# Patient Record
Sex: Female | Born: 1990 | Race: Black or African American | Hispanic: No | Marital: Single | Smoking: Never smoker
Health system: Southern US, Community
[De-identification: ages and names within clinical notes are randomized; demographics above are authoritative.]

## PROBLEM LIST (undated history)

## (undated) ENCOUNTER — Inpatient Hospital Stay (HOSPITAL_COMMUNITY): Payer: Self-pay

## (undated) DIAGNOSIS — F419 Anxiety disorder, unspecified: Secondary | ICD-10-CM

## (undated) DIAGNOSIS — F32A Depression, unspecified: Secondary | ICD-10-CM

## (undated) DIAGNOSIS — N750 Cyst of Bartholin's gland: Secondary | ICD-10-CM

## (undated) DIAGNOSIS — F329 Major depressive disorder, single episode, unspecified: Secondary | ICD-10-CM

## (undated) DIAGNOSIS — O139 Gestational [pregnancy-induced] hypertension without significant proteinuria, unspecified trimester: Secondary | ICD-10-CM

## (undated) DIAGNOSIS — D352 Benign neoplasm of pituitary gland: Secondary | ICD-10-CM

## (undated) HISTORY — DX: Benign neoplasm of pituitary gland: D35.2

## (undated) HISTORY — PX: TRANSPHENOIDAL / TRANSNASAL HYPOPHYSECTOMY / RESECTION PITUITARY TUMOR: SUR1382

---

## 2012-04-13 ENCOUNTER — Emergency Department (HOSPITAL_COMMUNITY)
Admission: EM | Admit: 2012-04-13 | Discharge: 2012-04-13 | Discharge status: Against Medical Advice | Payer: BC Managed Care – PPO | Source: Home / Self Care

## 2012-04-13 ENCOUNTER — Encounter (HOSPITAL_COMMUNITY): Payer: Self-pay | Admitting: *Deleted

## 2012-04-13 ENCOUNTER — Encounter (HOSPITAL_COMMUNITY): Payer: Self-pay

## 2012-04-13 DIAGNOSIS — R1011 Right upper quadrant pain: Secondary | ICD-10-CM | POA: Insufficient documentation

## 2012-04-13 DIAGNOSIS — R1013 Epigastric pain: Secondary | ICD-10-CM | POA: Insufficient documentation

## 2012-04-13 DIAGNOSIS — R109 Unspecified abdominal pain: Secondary | ICD-10-CM | POA: Insufficient documentation

## 2012-04-13 LAB — COMPREHENSIVE METABOLIC PANEL
ALT: 6 U/L (ref 0–35)
AST: 13 U/L (ref 0–37)
Alkaline Phosphatase: 53 U/L (ref 39–117)
CO2: 25 mEq/L (ref 19–32)
Chloride: 102 mEq/L (ref 96–112)
GFR calc non Af Amer: >90 mL/min (ref >90)
Sodium: 136 mEq/L (ref 135–145)
Total Bilirubin: 0.1 mg/dL — ABNORMAL LOW (ref 0.3–1.2)

## 2012-04-13 LAB — CBC WITH DIFFERENTIAL/PLATELET
Basophils Absolute: 0 10*3/uL (ref 0.0–0.1)
HCT: 37.1 % (ref 36.0–46.0)
Lymphocytes Relative: 21 % (ref 12–46)
Monocytes Absolute: 0.7 10*3/uL (ref 0.1–1.0)
Neutro Abs: 5.9 10*3/uL (ref 1.7–7.7)
Platelets: 415 10*3/uL — ABNORMAL HIGH (ref 150–400)
RBC: 4.43 MIL/uL (ref 3.87–5.11)
RDW: 14.6 % (ref 11.5–15.5)
WBC: 8.4 10*3/uL (ref 4.0–10.5)

## 2012-04-13 NOTE — ED Notes (Signed)
The pt cannot void she just voided

## 2012-04-13 NOTE — ED Notes (Signed)
Pt eating bojangles in waiting room, pt updated on wait times, and number ahead of her.

## 2012-04-13 NOTE — ED Notes (Signed)
Pt complans of cramp onright side, and flank area, feels hard to take a deep breath, bloated feeling, fever per school md, sts noreleif with gas x.

## 2012-04-13 NOTE — ED Notes (Signed)
Rt abd pain for one week with nausea.  She was here earlier today but left without being seen .  Blood drawn  Then no urine obtained.  lmp aug 24th.  Longer period

## 2012-04-14 ENCOUNTER — Emergency Department (HOSPITAL_COMMUNITY): Payer: BC Managed Care – PPO

## 2012-04-14 ENCOUNTER — Emergency Department (HOSPITAL_COMMUNITY)
Admission: EM | Admit: 2012-04-14 | Discharge: 2012-04-14 | Disposition: A | Discharge status: Home / Self Care | Payer: BC Managed Care – PPO | Attending: Emergency Medicine | Admitting: Emergency Medicine

## 2012-04-14 DIAGNOSIS — R109 Unspecified abdominal pain: Secondary | ICD-10-CM

## 2012-04-14 LAB — COMPREHENSIVE METABOLIC PANEL
ALT: 5 U/L (ref 0–35)
AST: 12 U/L (ref 0–37)
CO2: 25 mEq/L (ref 19–32)
Calcium: 9.6 mg/dL (ref 8.4–10.5)
Chloride: 100 mEq/L (ref 96–112)
Creatinine, Ser: 0.53 mg/dL (ref 0.50–1.10)
GFR calc Af Amer: >90 mL/min (ref >90)
GFR calc non Af Amer: >90 mL/min (ref >90)
Glucose, Bld: 126 mg/dL — ABNORMAL HIGH (ref 70–99)
Total Bilirubin: 0.1 mg/dL — ABNORMAL LOW (ref 0.3–1.2)

## 2012-04-14 LAB — URINE MICROSCOPIC-ADD ON

## 2012-04-14 LAB — URINALYSIS, ROUTINE W REFLEX MICROSCOPIC
Glucose, UA: NEGATIVE mg/dL
Ketones, ur: 15 mg/dL — AB
Protein, ur: NEGATIVE mg/dL
Urobilinogen, UA: 0.2 mg/dL (ref 0.0–1.0)

## 2012-04-14 LAB — CBC WITH DIFFERENTIAL/PLATELET
Basophils Absolute: 0 10*3/uL (ref 0.0–0.1)
Eosinophils Relative: 0 % (ref 0–5)
Lymphocytes Relative: 15 % (ref 12–46)
Lymphs Abs: 1.6 10*3/uL (ref 0.7–4.0)
MCH: 28.2 pg (ref 26.0–34.0)
Monocytes Absolute: 0.8 10*3/uL (ref 0.1–1.0)
Neutro Abs: 8.1 10*3/uL — ABNORMAL HIGH (ref 1.7–7.7)
RDW: 14.6 % (ref 11.5–15.5)

## 2012-04-14 MED ORDER — HYDROCODONE-ACETAMINOPHEN 5-325 MG PO TABS
1.0000 | ORAL_TABLET | Freq: Once | ORAL | Status: AC
  Administered 2012-04-14: 1 via ORAL
  Filled 2012-04-14: qty 1

## 2012-04-14 MED ORDER — TRAMADOL HCL 50 MG PO TABS: 50.0000 mg | ORAL_TABLET | Freq: Four times a day (QID) | ORAL | Status: AC | PRN

## 2012-04-14 NOTE — ED Notes (Signed)
nad noted, abc intact. 

## 2012-04-14 NOTE — ED Notes (Signed)
Taken to Ultrasound

## 2012-04-14 NOTE — ED Provider Notes (Signed)
History     CSN: [id number]  Arrival date & time 04/13/12  2033   First MD Initiated Contact with Patient 04/14/12 [phone]      Chief Complaint  Patient presents with  . Abdominal Pain    (Consider location/radiation/quality/duration/timing/severity/associated sxs/prior treatment) HPI Comments: Gloria Adams is a 21 y.o. Female who has had upper abdominal pain for one week. That waxes and wanes. She has slightly worse. Pain in the right upper quadrant after eating. She denies fever, chills, nausea, vomiting, weakness, dizziness, or back pain. She has brown vaginal discharge. She denies vaginal bleeding. Her last menstrual period was 03/22/2012. No other aggravating factors. No palliative factors.  Patient is a 21 y.o. female presenting with abdominal pain. The history is provided by the patient.  Abdominal Pain The primary symptoms of the illness include abdominal pain.    History reviewed. No pertinent past medical history.  No past surgical history on file.  No family history on file.  History  Substance Use Topics  . Smoking status: Never Smoker   . Smokeless tobacco: Not on file  . Alcohol Use: Yes    OB History    Grav Para Term Preterm Abortions TAB SAB Ect Mult Living                  Review of Systems  Gastrointestinal: Positive for abdominal pain.  All other systems reviewed and are negative.    Allergies  Review of patient's allergies indicates no known allergies.  Home Medications   Current Outpatient Rx  Name Route Sig Dispense Refill  . METRONIDAZOLE 500 MG PO TABS Oral Take 500 mg by mouth 2 (two) times daily. For 7 days; Start date 04/06/12    . NORGESTIMATE-ETH ESTRADIOL 0.25-35 MG-MCG PO TABS Oral Take 1 tablet by mouth at bedtime.    . TRAMADOL HCL 50 MG PO TABS Oral Take 1 tablet (50 mg total) by mouth every 6 (six) hours as needed for pain. 15 tablet 0    BP 107/72  Pulse 94  Temp 97.8 F (36.6 C) (Oral)  Resp 18  SpO2 97%  LMP  03/22/2012  Physical Exam  Nursing note and vitals reviewed. Constitutional: She is oriented to person, place, and time. She appears well-developed and well-nourished.  HENT:  Head: Normocephalic and atraumatic.  Eyes: Conjunctivae and EOM are normal. Pupils are equal, round, and reactive to light.  Neck: Normal range of motion and phonation normal. Neck supple.  Cardiovascular: Normal rate, regular rhythm and intact distal pulses.   Pulmonary/Chest: Effort normal and breath sounds normal. She exhibits no tenderness.  Abdominal: Soft. She exhibits no distension. There is no tenderness (Right upper quadrant, moderate left upper quadrant, mild. No suprapubic or lower quadrant tenderness). There is no guarding.  Musculoskeletal: Normal range of motion.  Neurological: She is alert and oriented to person, place, and time. She has normal strength. She exhibits normal muscle tone.  Skin: Skin is warm and dry.  Psychiatric: She has a normal mood and affect. Her behavior is normal. Judgment and thought content normal.    ED Course  Procedures (including critical care time) 01:05- she declines analgesic medication.      Labs Reviewed  CBC WITH DIFFERENTIAL - Abnormal; Notable for the following:    HCT 35.1 (*)     Neutro Abs 8.1 (*)     All other components within normal limits  COMPREHENSIVE METABOLIC PANEL - Abnormal; Notable for the following:    Sodium 134 (*)  Glucose, Bld 126 (*)     Total Bilirubin 0.1 (*)     All other components within normal limits  URINALYSIS, ROUTINE W REFLEX MICROSCOPIC - Abnormal; Notable for the following:    Hgb urine dipstick MODERATE (*)     Ketones, ur 15 (*)     Leukocytes, UA SMALL (*)     All other components within normal limits  LIPASE, BLOOD  PREGNANCY, URINE  URINE MICROSCOPIC-ADD ON   US Abdomen Complete  04/14/2012  *RADIOLOGY REPORT*  Clinical Data:  Right upper quadrant and epigastric pain  ABDOMINAL ULTRASOUND COMPLETE  Comparison:   None.  Findings:  Gallbladder:  No gallstones, gallbladder wall thickening, or pericholecystic fluid.The sonographic Murphy's sign is negative.  Common Bile Duct:  Within normal limits in caliber. Measures 3 mm.  Liver: No focal mass lesion identified.  Within normal limits in parenchymal echogenicity.  IVC:  Appears normal.  Pancreas:  No abnormality identified.  Spleen:  Within normal limits in size and echotexture.  Right kidney:  Normal in size and parenchymal echogenicity.  No evidence of mass or hydronephrosis.  Left kidney:  Normal in size and parenchymal echogenicity.  No evidence of mass or hydronephrosis.  Abdominal Aorta:  No aneurysm identified.  IMPRESSION: Negative abdominal ultrasound.   Original Report Authenticated By: Britta Mccreedy, M.D.      1. Abdominal pain       MDM  Nonspecific upper abdominal pain, persistent. Unable to get U/S in ED since ate chicken in WR. Will send pt to CDU for U/S after 8 hours of fasting.      Flint Melter, MD 04/14/12 [phone]

## 2012-04-14 NOTE — ED Notes (Signed)
The pt is eating a box of bogangles chicken and drinking a drink.  Advised no abd  sacn could be performed because she ate already.  She reports that her abd pain is almost gone

## 2012-04-14 NOTE — ED Provider Notes (Signed)
Care of the patient assumed in the CDU from Effie Shy, MD. Patient presented to the emergency department with complaint of one week of right upper quadrant abdominal pain which was worse after eating. She has not had any other symptoms with this. She was moved to CDU pending an abdominal ultrasound. Her lab work appears normal. The ultrasound of her abdomen does not show any acute abnormalities, specifically no gallbladder disease. Patient is currently asymptomatic resting comfortably. Feel she is stable for discharge home at this time with followup with her PCP. Reasons to return discussed.  Grant Fontana, PA-C 04/14/12 1131

## 2012-04-14 NOTE — ED Notes (Signed)
Patient moved to CDU 4 , alert oriented ambulatory without difficulty. States she isn't having any pain right now. Lying on stretcher talking on cellphone.

## 2012-04-14 NOTE — ED Notes (Signed)
Pt waiting for ultrasound at 0800

## 2012-04-14 NOTE — ED Notes (Signed)
Return from us

## 2012-04-15 NOTE — ED Provider Notes (Signed)
Medical screening examination/treatment/procedure(s) were performed by non-physician practitioner and as supervising physician I was immediately available for consultation/collaboration.  Jones Skene, M.D.     Jones Skene, MD 04/15/12 1510

## 2012-04-25 ENCOUNTER — Encounter (HOSPITAL_COMMUNITY): Payer: Self-pay | Admitting: *Deleted

## 2012-04-25 ENCOUNTER — Emergency Department (HOSPITAL_COMMUNITY)
Admission: EM | Admit: 2012-04-25 | Discharge: 2012-04-25 | Discharge status: Against Medical Advice | Payer: BC Managed Care – PPO | Attending: Emergency Medicine | Admitting: Emergency Medicine

## 2012-04-25 DIAGNOSIS — R109 Unspecified abdominal pain: Secondary | ICD-10-CM | POA: Insufficient documentation

## 2012-04-25 LAB — CBC WITH DIFFERENTIAL/PLATELET
HCT: 36.1 % (ref 36.0–46.0)
Hemoglobin: 12.1 g/dL (ref 12.0–15.0)
Lymphocytes Relative: 25 % (ref 12–46)
Lymphs Abs: 1.9 10*3/uL (ref 0.7–4.0)
Monocytes Relative: 9 % (ref 3–12)
Neutro Abs: 5.3 10*3/uL (ref 1.7–7.7)
Neutrophils Relative %: 66 % (ref 43–77)
RBC: 4.35 MIL/uL (ref 3.87–5.11)

## 2012-04-25 LAB — COMPREHENSIVE METABOLIC PANEL
Albumin: 3.7 g/dL (ref 3.5–5.2)
Alkaline Phosphatase: 80 U/L (ref 39–117)
BUN: 11 mg/dL (ref 6–23)
CO2: 27 mEq/L (ref 19–32)
Chloride: 99 mEq/L (ref 96–112)
GFR calc non Af Amer: >90 mL/min (ref >90)
Glucose, Bld: 122 mg/dL — ABNORMAL HIGH (ref 70–99)
Potassium: 3.2 mEq/L — ABNORMAL LOW (ref 3.5–5.1)
Total Bilirubin: 0.3 mg/dL (ref 0.3–1.2)

## 2012-04-25 NOTE — ED Notes (Signed)
Called for pt, no answer

## 2012-04-25 NOTE — ED Notes (Signed)
The pt cannot void at present 

## 2012-04-25 NOTE — ED Notes (Signed)
Rt abd pain for 2 weeks and she was seen here then.  No improvement in the pain.  lmp 3 days ago.  No nv or diarrhea   More gas than usual

## 2013-02-10 ENCOUNTER — Inpatient Hospital Stay (HOSPITAL_COMMUNITY)
Admission: AD | Admit: 2013-02-10 | Discharge: 2013-02-10 | Disposition: A | Discharge status: Home / Self Care | Payer: Self-pay | Source: Ambulatory Visit | Attending: Obstetrics & Gynecology | Admitting: Obstetrics & Gynecology

## 2013-02-10 ENCOUNTER — Encounter (HOSPITAL_COMMUNITY): Payer: Self-pay | Admitting: *Deleted

## 2013-02-10 DIAGNOSIS — B9689 Other specified bacterial agents as the cause of diseases classified elsewhere: Secondary | ICD-10-CM | POA: Insufficient documentation

## 2013-02-10 DIAGNOSIS — N750 Cyst of Bartholin's gland: Secondary | ICD-10-CM | POA: Insufficient documentation

## 2013-02-10 DIAGNOSIS — N760 Acute vaginitis: Secondary | ICD-10-CM | POA: Insufficient documentation

## 2013-02-10 DIAGNOSIS — A499 Bacterial infection, unspecified: Secondary | ICD-10-CM | POA: Insufficient documentation

## 2013-02-10 MED ORDER — OXYCODONE-ACETAMINOPHEN 5-325 MG PO TABS
2.0000 | ORAL_TABLET | ORAL | Status: AC
  Administered 2013-02-10: 2 via ORAL
  Filled 2013-02-10: qty 2

## 2013-02-10 MED ORDER — LIDOCAINE 5 % EX OINT
TOPICAL_OINTMENT | CUTANEOUS | Status: AC
  Administered 2013-02-10: 18:00:00 via TOPICAL
  Filled 2013-02-10: qty 35.44

## 2013-02-10 NOTE — MAU Note (Signed)
C/o vaginal boil- ? Bartholin cyst;

## 2013-02-10 NOTE — MAU Note (Signed)
Want to [facility] 4 days ago.  7 days ago, felt a little puffiness down there, 5 days ago had sex- felt like a ball down there.  At HD was told poss bartholin's cyst.  Was told if got worse would need to get seen.   Did sitz baths, is now way worse and way bigger.

## 2013-02-10 NOTE — MAU Provider Note (Signed)
Chief Complaint: Bartholin's Cyst   None    SUBJECTIVE HPI: Gloria Adams is a 22 y.o. G0P0 at who presents to maternity admissions reporting a Bartholin cyst diagnosed at the health department this week.  She has never had this before.  She was told they cannot drain the cysts there and she should come to [facility].  She also has recurrent BV and is finishing her course of Flagyl at this time.  She denies vaginal bleeding, vaginal itching/burning, urinary symptoms, h/a, dizziness, n/v, or fever/chills.      Past Medical History  Diagnosis Date  . Medical history non-contributory    Past Surgical History  Procedure Laterality Date  . No past surgeries     History   Social History  . Marital Status: Single    Spouse Name: N/A    Number of Children: N/A  . Years of Education: N/A   Occupational History  . Not on file.   Social History Main Topics  . Smoking status: Never Smoker   . Smokeless tobacco: Not on file  . Alcohol Use: No  . Drug Use: No  . Sexually Active: Not on file   Other Topics Concern  . Not on file   Social History Narrative  . No narrative on file   No current facility-administered medications on file prior to encounter.   Current Outpatient Prescriptions on File Prior to Encounter  Medication Sig Dispense Refill  . metroNIDAZOLE (FLAGYL) 500 MG tablet Take 500 mg by mouth 2 (two) times daily. X 7 days; started 02/07/13       No Known Allergies  ROS: Pertinent items in HPI  OBJECTIVE Blood pressure 118/78, pulse 78, temperature 98.1 F (36.7 C), temperature source Oral, resp. rate 16, height 5' 0.5" (1.537 m), weight 62.143 kg (137 lb), last menstrual period 01/26/2013. GENERAL: Well-developed, well-nourished female in no acute distress.  HEENT: Normocephalic HEART: normal rate RESP: normal effort ABDOMEN: Soft, non-tender EXTREMITIES: Nontender, no edema NEURO: Alert and oriented  Bartholin Cyst I&D  Percocet 5/325 x2 tabs and  topical Lidocaine ointment 5% was used to premedication pt  Enlarged abscess palpated in front of the hymenal ring around 5 or 7 o' clock.  Discussed complications and possible outcomes of procedure including recurrence of cyst, scarring leading to infecton, bleeding, dyspareunia, distortion of anatomy.  Patient was examined in the dorsal lithotomy position and mass was identified.  The area was prepped with Iodine and draped in a sterile manner. A 1cm incision was made using a sterile scapel. Upon palpation of the mass, a moderate amount of bloody purulent drainage was expressed through the incision. A hemostat was used to break up loculations, which resulted in expression of more bloody purulent drainage.  The open cyst was then copiously irrigated with normal saline. Patient tolerated the procedure well, reported feeling " a lot better." - Recommended Sitz baths bid and Motrin and Percocet was given  prn pain.   She was told to call to be examined if she experiences increasing swelling, pain, vaginal discharge, or fever.  - She was instructed to wear a peripad to absorb discharge.  -The pt is to follow up in Gyn clinic as needed for persistence/recurrence of cyst.   ASSESSMENT 1. Bartholin cyst     PLAN  Discharge home Ibuprofen for pain Discussed probiotics including yogurt for recurrent BV treatment/prevention F/U in Gyn clinic as needed Return to MAU as needed     Sharen Counter Certified Nurse-Midwife 02/10/2013  5:31  PM

## 2013-02-12 NOTE — MAU Provider Note (Signed)
Attestation of Attending Supervision of Advanced Practitioner (PA/CNM/NP): Evaluation and management procedures were performed by the Advanced Practitioner under my supervision and collaboration.  I have reviewed the Advanced Practitioner's note and chart, and I agree with the management and plan.  Alisandra Son, MD, FACOG Attending Obstetrician & Gynecologist Faculty Practice, [facility]  

## 2013-06-04 ENCOUNTER — Inpatient Hospital Stay (HOSPITAL_COMMUNITY)
Admission: AD | Admit: 2013-06-04 | Discharge: 2013-06-04 | Disposition: A | Discharge status: Home / Self Care | Payer: BC Managed Care – PPO | Source: Ambulatory Visit | Attending: Obstetrics and Gynecology | Admitting: Obstetrics and Gynecology

## 2013-06-04 ENCOUNTER — Encounter (HOSPITAL_COMMUNITY): Payer: Self-pay

## 2013-06-04 DIAGNOSIS — N750 Cyst of Bartholin's gland: Secondary | ICD-10-CM | POA: Insufficient documentation

## 2013-06-04 MED ORDER — HYDROCODONE-IBUPROFEN 5-200 MG PO TABS: 1.0000 | ORAL_TABLET | Freq: Four times a day (QID) | ORAL | Status: DC | PRN

## 2013-06-04 NOTE — MAU Note (Addendum)
Vagina on rt side is swelling, first noted 3 wks. Ago, has gotten worse.  Thinks it has gotten bigger. Uncomfortable to sit and walk. Started draining yesterday

## 2013-06-04 NOTE — MAU Note (Signed)
Patient states that she had her bartholin's cyst lanced this summer. She states that the area is swollen again and have gotten painful in the last few days. She states that it painful to walk, and sit.

## 2013-06-04 NOTE — MAU Provider Note (Signed)
  History     CSN: [id number]  Arrival date and time: 06/04/13 1219   First Provider Initiated Contact with Patient 06/04/13 1326      Chief Complaint  Patient presents with  . Groin Swelling   HPI  Gloria Adams is a 22 y.o. G0P0 who presents today with a "cyst". She states that she had one drained over the summer, and now has another one. She states that she was "messing with it" yesterday and it was bleeding. Today she states that is it hard. She states that is has been present for about 2 weeks, but it did not bother her until about 2 days ago. She was recently treated for BV.   Past Medical History  Diagnosis Date  . Medical history non-contributory     Past Surgical History  Procedure Laterality Date  . No past surgeries      Family History  Problem Relation Age of Onset  . Hypertension Father   . Diabetes Maternal Grandmother   . Diabetes Paternal Grandmother     History  Substance Use Topics  . Smoking status: Never Smoker   . Smokeless tobacco: Not on file  . Alcohol Use: No    Allergies: No Known Allergies  Prescriptions prior to admission  Medication Sig Dispense Refill  . ibuprofen (ADVIL,MOTRIN) 200 MG tablet Take 600 mg by mouth every 6 (six) hours as needed for pain.        ROS Physical Exam   Blood pressure 115/85, pulse 99, temperature 99.6 F (37.6 C), temperature source Oral, resp. rate 16, height 5' 0.5" (1.537 m), weight 68.04 kg (150 lb), last menstrual period 05/09/2013.  Physical Exam  Nursing note and vitals reviewed. Constitutional: She is oriented to person, place, and time. She appears well-developed and well-nourished. No distress.  Cardiovascular: Normal rate.   Respiratory: Effort normal.  GI: Soft.  Genitourinary:   External: small 1cmx1cm firm, non-tender, non-fluctuant, non-erythematous bartholins cyst. Not ready for I&D.   Neurological: She is alert and oriented to person, place, and time.  Skin: Skin is warm and dry.   Psychiatric: She has a normal mood and affect.    MAU Course  Procedures    Assessment and Plan   1. Bartholin gland cyst    Discussed when it would need to be drained Patient does not have a PCP Will return here when it seems ready to be drained Warm soaks BID-TID Has lidocaine jelly at home, may use PRN    Medication List         hydrocodone-ibuprofen 5-200 MG per tablet  Commonly known as:  VICOPROFEN  Take 1-2 tablets by mouth every 6 (six) hours as needed for pain.     ibuprofen 200 MG tablet  Commonly known as:  ADVIL,MOTRIN  Take 600 mg by mouth every 6 (six) hours as needed for pain.         Tawnya Crook 06/04/2013, 1:27 PM

## 2013-06-06 ENCOUNTER — Inpatient Hospital Stay (HOSPITAL_COMMUNITY)
Admission: AD | Admit: 2013-06-06 | Discharge: 2013-06-06 | Disposition: A | Discharge status: Home / Self Care | Payer: BC Managed Care – PPO | Source: Ambulatory Visit | Attending: Family Medicine | Admitting: Family Medicine

## 2013-06-06 DIAGNOSIS — N949 Unspecified condition associated with female genital organs and menstrual cycle: Secondary | ICD-10-CM | POA: Insufficient documentation

## 2013-06-06 DIAGNOSIS — N750 Cyst of Bartholin's gland: Secondary | ICD-10-CM

## 2013-06-06 MED ORDER — CEFAZOLIN SODIUM-DEXTROSE 2-3 GM-% IV SOLR
INTRAVENOUS | Status: AC
  Filled 2013-06-06: qty 50

## 2013-06-06 MED ORDER — ONDANSETRON HCL 4 MG/2ML IJ SOLN
INTRAMUSCULAR | Status: AC
  Filled 2013-06-06: qty 2

## 2013-06-06 MED ORDER — FENTANYL CITRATE 0.05 MG/ML IJ SOLN
INTRAMUSCULAR | Status: AC
  Filled 2013-06-06: qty 2

## 2013-06-06 MED ORDER — LIDOCAINE HCL 2 % EX GEL
Freq: Once | CUTANEOUS | Status: AC
  Administered 2013-06-06: 5 via TOPICAL
  Filled 2013-06-06: qty 5

## 2013-06-06 MED ORDER — DEXTROSE 5 % IV SOLN
INTRAVENOUS | Status: AC
  Filled 2013-06-06: qty 3000

## 2013-06-06 MED ORDER — EPHEDRINE 5 MG/ML INJ
INTRAVENOUS | Status: AC
  Filled 2013-06-06: qty 10

## 2013-06-06 MED ORDER — IBUPROFEN 600 MG PO TABS
600.0000 mg | ORAL_TABLET | Freq: Once | ORAL | Status: AC
  Administered 2013-06-06: 600 mg via ORAL
  Filled 2013-06-06: qty 1

## 2013-06-06 MED ORDER — LIDOCAINE 5 % EX OINT
TOPICAL_OINTMENT | Freq: Once | CUTANEOUS | Status: DC
  Filled 2013-06-06: qty 35.44

## 2013-06-06 MED ORDER — OXYTOCIN 10 UNIT/ML IJ SOLN
INTRAMUSCULAR | Status: AC
  Filled 2013-06-06: qty 4

## 2013-06-06 MED ORDER — PHENYLEPHRINE HCL 10 MG/ML IJ SOLN
INTRAMUSCULAR | Status: AC
  Filled 2013-06-06: qty 1

## 2013-06-06 MED ORDER — MORPHINE SULFATE 0.5 MG/ML IJ SOLN
INTRAMUSCULAR | Status: AC
  Filled 2013-06-06: qty 10

## 2013-06-06 NOTE — MAU Provider Note (Signed)
Chart reviewed and agree with management and plan.  

## 2013-06-06 NOTE — MAU Note (Signed)
Seen here 2 days ago for a cyst in her vaginal area but we were unable to drain it because it wasn't ready but the pain is much worse tonight.

## 2013-06-06 NOTE — MAU Provider Note (Signed)
History     CSN: [id number]  Arrival date and time: 06/06/13 4098   First Provider Initiated Contact with Patient 06/06/13 0104      Chief Complaint  Patient presents with  . Groin Swelling   HPI  Ms. Gloria Adams is a 22 y.o. female G0P0 who presents with swelling in her vagina. She was here two days ago and told that she had a swollen bartholin's gland that was not ready to be Incised. The patient went home and continued warm soaks without relief. The patient feels the gland is larger, and much for tender, making it difficult for the patient to sit or walk.   OB History   Grav Para Term Preterm Abortions TAB SAB Ect Mult Living   0               Past Medical History  Diagnosis Date  . Medical history non-contributory     Past Surgical History  Procedure Laterality Date  . No past surgeries      Family History  Problem Relation Age of Onset  . Hypertension Father   . Diabetes Maternal Grandmother   . Diabetes Paternal Grandmother     History  Substance Use Topics  . Smoking status: Never Smoker   . Smokeless tobacco: Not on file  . Alcohol Use: No    Allergies: No Known Allergies  Prescriptions prior to admission  Medication Sig Dispense Refill  . hydrocodone-ibuprofen (VICOPROFEN) 5-200 MG per tablet Take 1-2 tablets by mouth every 6 (six) hours as needed for pain.  10 tablet  0  . ibuprofen (ADVIL,MOTRIN) 200 MG tablet Take 600 mg by mouth every 6 (six) hours as needed for pain.       Results for orders placed during the hospital encounter of 06/06/13 (from the past 24 hour(s))  POCT PREGNANCY, URINE     Status: None   Collection Time    06/06/13  1:36 AM      Result Value Range   Preg Test, Ur NEGATIVE  NEGATIVE    Review of Systems  Constitutional: Negative for fever and chills.  Gastrointestinal: Negative for heartburn, nausea, vomiting and abdominal pain.  Genitourinary: Negative for dysuria, urgency, frequency and hematuria.  Skin:   + swelling in vagina.   Neurological: Negative for headaches.   Physical Exam   Blood pressure 139/79, pulse 108, temperature 99.7 F (37.6 C), temperature source Oral, resp. rate 20, height 5' (1.524 m), weight 68.04 kg (150 lb), last menstrual period 05/09/2013, SpO2 99.00%.  Physical Exam  Constitutional: She is oriented to person, place, and time. She appears well-developed and well-nourished. No distress.  Eyes: Pupils are equal, round, and reactive to light.  Neck: Normal range of motion.  Genitourinary:  External: 2cm X 2cm soft, tender, fluctuant, erythematous right bartholins cyst.   Neurological: She is alert and oriented to person, place, and time.  Skin: Skin is warm. She is not diaphoretic.    MAU Course  Procedures  Consent form signed. Time out.   Patient positioned and draped with sterile towels.  Preoperative medication: Lidocaine gel applied to the area. Ibuprofen 600 mg PO, unable to give percocet, patient is driving  Area cleaned with betadine Local infiltrate with lidocaine 1%. Amount 2 ccs  I&D Scalpel size: #11blade Incision type: Straight single Complexity: Moderate Complexity  Drained large amount of purulent drainage Probed with curved hemostat to break up loculations, minimal drainage following hemostat.  Irrigated with NSS  Wound treatment: Word Catheter  placed and inflated with 3 cc. Of NS Packing: none Patient tolerance: Tolerated procedure well.    MDM Urine pregnancy test negative Ibuprofen 600 mg PO  Assessment and Plan  A: Incision and drainage of right bartholin's cyst   P: Discharge home Pt has vicoprofen prescription  Ok to take ibuprofen as needed as directed on the bottle Recommended Sitz baths She was told to call to be examined if she experiences increasing swelling, pain, vaginal discharge, or fever.  - She was instructed to wear a peripad to absorb discharge, and to maintain pelvic rest while the Word catheter is in  place.  The catheter will be left in place for at least two to four weeks to promote formation of an epithelialized tract for permanent drainage of glandular secretions.  She will need an appointment in GYN Clinicin 4-6 weeks from now for removal of word catheter. Referral to GYN clinic made.   Prestyn Mahn IRENE FNP-C  06/06/2013, 1:59 AM     -

## 2013-06-06 NOTE — MAU Note (Signed)
Pt was in MAU 2 days about w/ Bartholin's cyst. Now pt reports pain to be unbearable.

## 2013-06-08 NOTE — MAU Provider Note (Signed)
Attestation of Attending Supervision of Advanced Practitioner (CNM/NP): Evaluation and management procedures were performed by the Advanced Practitioner under my supervision and collaboration.  I have reviewed the Advanced Practitioner's note and chart, and I agree with the management and plan.  Carney Saxton 06/08/2013 4:06 PM   

## 2013-06-13 ENCOUNTER — Encounter: Payer: BC Managed Care – PPO | Admitting: Medical

## 2013-06-23 ENCOUNTER — Encounter: Payer: BC Managed Care – PPO | Admitting: Medical

## 2013-06-26 ENCOUNTER — Other Ambulatory Visit: Payer: Self-pay

## 2013-06-26 LAB — OB RESULTS CONSOLE GC/CHLAMYDIA
CHLAMYDIA, DNA PROBE: NEGATIVE
Gonorrhea: NEGATIVE

## 2013-06-26 LAB — OB RESULTS CONSOLE RPR: RPR: NONREACTIVE

## 2013-06-26 LAB — OB RESULTS CONSOLE ABO/RH: RH Type: POSITIVE

## 2013-06-26 LAB — OB RESULTS CONSOLE ANTIBODY SCREEN: Antibody Screen: NEGATIVE

## 2013-06-26 LAB — OB RESULTS CONSOLE RUBELLA ANTIBODY, IGM: RUBELLA: IMMUNE

## 2013-06-26 LAB — OB RESULTS CONSOLE HEPATITIS B SURFACE ANTIGEN: HEP B S AG: NEGATIVE

## 2013-07-24 ENCOUNTER — Other Ambulatory Visit (HOSPITAL_COMMUNITY): Payer: Self-pay | Admitting: Nurse Practitioner

## 2013-07-24 DIAGNOSIS — Z3682 Encounter for antenatal screening for nuchal translucency: Secondary | ICD-10-CM

## 2013-08-07 NOTE — L&D Delivery Note (Signed)
NICU was called to be in attendance of birth d/t moderate meconium, but they and I both arrived immediately after infant was birthed.  Infant to NICU d/t O2 requirements.  Hemostatic shallow Lt labial, no repair needed.  Plans to breastfeed, pills for contraception, declines circumcision.  Roma Schanz, CNM, [facility] 02/08/2014 6:54 AM

## 2013-08-07 NOTE — L&D Delivery Note (Signed)
Delivery Note At 6:26 AM a viable female was delivered via  (Presentation: ; LOA ).  APGAR: pending weight .   Placenta status:intact, trailing membranes .  Cord:  with the following complications: none .  Cord pH: arterial sent  Anesthesia:   Episiotomy: n/a Lacerations: left labial Suture Repair: n/a Est. Blood Loss (mL): 200cc  Pt developed a temp to 100.4 immediately prior to pushing, amp/gent order per chorio protocol. Pt labored down and pushed with good maternal effort to deliver a liveborn female via NSVD over intact perineum. Infant noted to have early/variable decels immediately prior to delivery. Delivery complicated by shoulder dystocia, mcroberts required. Anterior armpit hooked and delivered with downward traction. Moderate mec stained fluid noted. Cord clamed and immediately cut. Taken to radiant warmer, code apgar called. NICU arrived immediately. Infant noted to be grunting, requiring vigorous suctioning and blow by. Successfully resuc'd but still requiring supplemental O2. Placenta delivered intact with 3V cord via traction and pitocin. Labial tear hemostatic. No further complications. Counts confirmed and were correct   Mom to postpartum.  Baby to NICU.   Langston Masker 02/08/2014, 6:40 AM

## 2013-08-11 ENCOUNTER — Ambulatory Visit (HOSPITAL_COMMUNITY): Admission: RE | Admit: 2013-08-11 | Payer: Medicaid Other | Source: Ambulatory Visit

## 2013-08-11 ENCOUNTER — Encounter (HOSPITAL_COMMUNITY): Payer: Self-pay

## 2013-08-11 ENCOUNTER — Ambulatory Visit (HOSPITAL_COMMUNITY)
Admission: RE | Admit: 2013-08-11 | Discharge: 2013-08-11 | Disposition: A | Discharge status: Home / Self Care | Payer: Medicaid Other | Source: Ambulatory Visit | Attending: Nurse Practitioner | Admitting: Nurse Practitioner

## 2013-08-11 ENCOUNTER — Other Ambulatory Visit (HOSPITAL_COMMUNITY): Payer: Self-pay | Admitting: Nurse Practitioner

## 2013-08-11 DIAGNOSIS — O3510X0 Maternal care for (suspected) chromosomal abnormality in fetus, unspecified, not applicable or unspecified: Secondary | ICD-10-CM | POA: Insufficient documentation

## 2013-08-11 DIAGNOSIS — Z3689 Encounter for other specified antenatal screening: Secondary | ICD-10-CM | POA: Insufficient documentation

## 2013-08-11 DIAGNOSIS — Z3682 Encounter for antenatal screening for nuchal translucency: Secondary | ICD-10-CM

## 2013-08-11 DIAGNOSIS — O351XX0 Maternal care for (suspected) chromosomal abnormality in fetus, not applicable or unspecified: Secondary | ICD-10-CM | POA: Insufficient documentation

## 2013-08-12 ENCOUNTER — Other Ambulatory Visit (HOSPITAL_COMMUNITY): Payer: Self-pay | Admitting: Nurse Practitioner

## 2013-08-12 DIAGNOSIS — Z3682 Encounter for antenatal screening for nuchal translucency: Secondary | ICD-10-CM

## 2013-08-13 ENCOUNTER — Other Ambulatory Visit: Payer: Self-pay

## 2013-08-13 ENCOUNTER — Encounter (HOSPITAL_COMMUNITY): Payer: Self-pay

## 2013-08-13 ENCOUNTER — Ambulatory Visit (HOSPITAL_COMMUNITY)
Admission: RE | Admit: 2013-08-13 | Discharge: 2013-08-13 | Disposition: A | Discharge status: Home / Self Care | Payer: Medicaid Other | Source: Ambulatory Visit | Attending: Nurse Practitioner | Admitting: Nurse Practitioner

## 2013-08-13 DIAGNOSIS — O3510X0 Maternal care for (suspected) chromosomal abnormality in fetus, unspecified, not applicable or unspecified: Secondary | ICD-10-CM | POA: Insufficient documentation

## 2013-08-13 DIAGNOSIS — Z3689 Encounter for other specified antenatal screening: Secondary | ICD-10-CM | POA: Insufficient documentation

## 2013-08-13 DIAGNOSIS — Z3682 Encounter for antenatal screening for nuchal translucency: Secondary | ICD-10-CM

## 2013-08-13 DIAGNOSIS — O351XX0 Maternal care for (suspected) chromosomal abnormality in fetus, not applicable or unspecified: Secondary | ICD-10-CM | POA: Insufficient documentation

## 2013-08-22 ENCOUNTER — Other Ambulatory Visit (HOSPITAL_COMMUNITY): Payer: Self-pay | Admitting: Nurse Practitioner

## 2013-08-22 DIAGNOSIS — Z3689 Encounter for other specified antenatal screening: Secondary | ICD-10-CM

## 2013-09-19 ENCOUNTER — Ambulatory Visit (HOSPITAL_COMMUNITY)
Admission: RE | Admit: 2013-09-19 | Discharge: 2013-09-19 | Disposition: A | Discharge status: Home / Self Care | Payer: Medicaid Other | Source: Ambulatory Visit | Attending: Nurse Practitioner | Admitting: Nurse Practitioner

## 2013-09-19 DIAGNOSIS — Z3689 Encounter for other specified antenatal screening: Secondary | ICD-10-CM

## 2013-09-25 ENCOUNTER — Other Ambulatory Visit (HOSPITAL_COMMUNITY): Payer: Self-pay | Admitting: Nurse Practitioner

## 2013-09-25 DIAGNOSIS — Z1389 Encounter for screening for other disorder: Secondary | ICD-10-CM

## 2013-09-25 DIAGNOSIS — Z0374 Encounter for suspected problem with fetal growth ruled out: Secondary | ICD-10-CM

## 2013-09-25 DIAGNOSIS — Z0372 Encounter for suspected placental problem ruled out: Secondary | ICD-10-CM

## 2013-10-14 ENCOUNTER — Ambulatory Visit (HOSPITAL_COMMUNITY)
Admission: RE | Admit: 2013-10-14 | Discharge: 2013-10-14 | Disposition: A | Discharge status: Home / Self Care | Payer: Medicaid Other | Source: Ambulatory Visit | Attending: Nurse Practitioner | Admitting: Nurse Practitioner

## 2013-10-14 DIAGNOSIS — Z1389 Encounter for screening for other disorder: Secondary | ICD-10-CM

## 2013-10-14 DIAGNOSIS — O4400 Placenta previa specified as without hemorrhage, unspecified trimester: Secondary | ICD-10-CM | POA: Insufficient documentation

## 2013-10-14 DIAGNOSIS — Z3689 Encounter for other specified antenatal screening: Secondary | ICD-10-CM | POA: Insufficient documentation

## 2013-10-14 DIAGNOSIS — Z0374 Encounter for suspected problem with fetal growth ruled out: Secondary | ICD-10-CM

## 2013-10-14 DIAGNOSIS — Z0372 Encounter for suspected placental problem ruled out: Secondary | ICD-10-CM

## 2013-12-04 LAB — OB RESULTS CONSOLE HIV ANTIBODY (ROUTINE TESTING): HIV: NONREACTIVE

## 2014-01-01 ENCOUNTER — Encounter (HOSPITAL_COMMUNITY): Payer: Self-pay | Admitting: General Practice

## 2014-01-01 ENCOUNTER — Inpatient Hospital Stay (HOSPITAL_COMMUNITY)
Admission: AD | Admit: 2014-01-01 | Discharge: 2014-01-01 | Disposition: A | Discharge status: Home / Self Care | Payer: Medicaid Other | Source: Ambulatory Visit | Attending: Obstetrics & Gynecology | Admitting: Obstetrics & Gynecology

## 2014-01-01 DIAGNOSIS — B379 Candidiasis, unspecified: Secondary | ICD-10-CM

## 2014-01-01 DIAGNOSIS — O2390 Unspecified genitourinary tract infection in pregnancy, unspecified trimester: Secondary | ICD-10-CM | POA: Insufficient documentation

## 2014-01-01 DIAGNOSIS — O4700 False labor before 37 completed weeks of gestation, unspecified trimester: Secondary | ICD-10-CM | POA: Insufficient documentation

## 2014-01-01 DIAGNOSIS — B373 Candidiasis of vulva and vagina: Secondary | ICD-10-CM | POA: Insufficient documentation

## 2014-01-01 DIAGNOSIS — B3731 Acute candidiasis of vulva and vagina: Secondary | ICD-10-CM | POA: Insufficient documentation

## 2014-01-01 LAB — URINALYSIS, ROUTINE W REFLEX MICROSCOPIC
BILIRUBIN URINE: NEGATIVE
GLUCOSE, UA: NEGATIVE mg/dL
Hgb urine dipstick: NEGATIVE
KETONES UR: NEGATIVE mg/dL
Leukocytes, UA: NEGATIVE
NITRITE: NEGATIVE
Protein, ur: NEGATIVE mg/dL
Specific Gravity, Urine: 1.015 (ref 1.005–1.030)
Urobilinogen, UA: 0.2 mg/dL (ref 0.0–1.0)
pH: 6 (ref 5.0–8.0)

## 2014-01-01 MED ORDER — FLUCONAZOLE 150 MG PO TABS
150.0000 mg | ORAL_TABLET | Freq: Once | ORAL | Status: AC
  Administered 2014-01-01: 150 mg via ORAL
  Filled 2014-01-01: qty 1

## 2014-01-01 MED ORDER — NIFEDIPINE 10 MG PO CAPS
20.0000 mg | ORAL_CAPSULE | Freq: Once | ORAL | Status: AC
  Administered 2014-01-01: 20 mg via ORAL
  Filled 2014-01-01: qty 2

## 2014-01-01 NOTE — Discharge Instructions (Signed)
Preterm Labor Information Preterm labor is when labor starts at less than 37 weeks of pregnancy. The normal length of a pregnancy is 39 to 41 weeks. CAUSES Often, there is no identifiable underlying cause as to why a woman goes into preterm labor. One of the most common known causes of preterm labor is infection. Infections of the uterus, cervix, vagina, amniotic sac, bladder, kidney, or even the lungs (pneumonia) can cause labor to start. Other suspected causes of preterm labor include:   Urogenital infections, such as yeast infections and bacterial vaginosis.   Uterine abnormalities (uterine shape, uterine septum, fibroids, or bleeding from the placenta).   A cervix that has been operated on (it may fail to stay closed).   Malformations in the fetus.   Multiple gestations (twins, triplets, and so on).   Breakage of the amniotic sac.  RISK FACTORS  Having a previous history of preterm labor.   Having premature rupture of membranes (PROM).   Having a placenta that covers the opening of the cervix (placenta previa).   Having a placenta that separates from the uterus (placental abruption).   Having a cervix that is too weak to hold the fetus in the uterus (incompetent cervix).   Having too much fluid in the amniotic sac (polyhydramnios).   Taking illegal drugs or smoking while pregnant.   Not gaining enough weight while pregnant.   Being younger than 18 and older than 21 years old.   Having a low socioeconomic status.   Being African American. SYMPTOMS Signs and symptoms of preterm labor include:   Menstrual-like cramps, abdominal pain, or back pain.  Uterine contractions that are regular, as frequent as six in an hour, regardless of their intensity (may be mild or painful).  Contractions that start on the top of the uterus and spread down to the lower abdomen and back.   A sense of increased pelvic pressure.   A watery or bloody mucus discharge that  comes from the vagina.  TREATMENT Depending on the length of the pregnancy and other circumstances, your health care provider may suggest bed rest. If necessary, there are medicines that can be given to stop contractions and to mature the fetal lungs. If labor happens before 34 weeks of pregnancy, a prolonged hospital stay may be recommended. Treatment depends on the condition of both you and the fetus.  WHAT SHOULD YOU DO IF YOU THINK YOU ARE IN PRETERM LABOR? Call your health care provider right away. You will need to go to the hospital to get checked immediately. HOW CAN YOU PREVENT PRETERM LABOR IN FUTURE PREGNANCIES? You should:   Stop smoking if you smoke.  Maintain healthy weight gain and avoid chemicals and drugs that are not necessary.  Be watchful for any type of infection.  Inform your health care provider if you have a known history of preterm labor. Document Released: 10/14/2003 Document Revised: 03/26/2013 Document Reviewed: 08/26/2012 Exit[employer] Patient Information 2014 [employer].    

## 2014-01-01 NOTE — MAU Provider Note (Signed)
Chief Complaint:  Labor Eval   Gloria Adams is a 23 y.o.  G1P0000 with IUP at [redacted]w[redacted]d presenting for Labor Eval  She is having increased contractions [facility]. She had 5 contractions from 6 am to 2 pm then she had five contractions in an hour at the health department.  Her cervix was checked at the health department and was reported to be closed. Due to still having contractions she was told to be evaluated at the MAU.   Menstrual History: OB History   Grav Para Term Preterm Abortions TAB SAB Ect Mult Living   1 0 0 0 0 0 0 0 0 0       Patient's last menstrual period was 05/09/2013.      Past Medical History  Diagnosis Date  . Medical history non-contributory     Past Surgical History  Procedure Laterality Date  . No past surgeries      Family History  Problem Relation Age of Onset  . Hypertension Father   . Diabetes Maternal Grandmother   . Diabetes Paternal Grandmother     History  Substance Use Topics  . Smoking status: Never Smoker   . Smokeless tobacco: Not on file  . Alcohol Use: No     No Known Allergies  Prescriptions prior to admission  Medication Sig Dispense Refill  . Prenatal Vit-Fe Fumarate-FA (PRENATAL MULTIVITAMIN) TABS tablet Take 1 tablet by mouth daily at 12 noon.        Review of Systems - Negative except for what is mentioned in HPI.  Physical Exam  Blood pressure 122/76, pulse 96, temperature 99.1 F (37.3 C), temperature source Oral, resp. rate 16, height 5\' 1"  (1.549 m), weight 71.668 kg (158 lb), last menstrual period 05/09/2013, SpO2 100.00%. GENERAL: Well-developed, well-nourished female in no acute distress.  LUNGS: Clear to auscultation bilaterally.  HEART: Regular rate and rhythm. ABDOMEN: Soft, nontender, nondistended, gravid.  EXTREMITIES: Nontender, no edema, 2+ distal pulses. Cervical Exam: Dilatation closed   Effacement thick   Station -3   FHT:  Baseline rate 135 bpm   Variability  moderate  Accelerations present   Decelerations none Contractions: none s/p procardia    Labs: Results for orders placed during the hospital encounter of 01/01/14 (from the past 24 hour(s))  URINALYSIS, ROUTINE W REFLEX MICROSCOPIC   Collection Time    01/01/14  5:30 PM      Result Value Ref Range   Color, Urine YELLOW  YELLOW   APPearance CLEAR  CLEAR   Specific Gravity, Urine 1.015  1.005 - 1.030   pH 6.0  5.0 - 8.0   Glucose, UA NEGATIVE  NEGATIVE mg/dL   Hgb urine dipstick NEGATIVE  NEGATIVE   Bilirubin Urine NEGATIVE  NEGATIVE   Ketones, ur NEGATIVE  NEGATIVE mg/dL   Protein, ur NEGATIVE  NEGATIVE mg/dL   Urobilinogen, UA 0.2  0.0 - 1.0 mg/dL   Nitrite NEGATIVE  NEGATIVE   Leukocytes, UA NEGATIVE  NEGATIVE    Imaging Studies:  No results found.  Assessment: Gloria Adams is  23 y.o. G1P0000 at [redacted]w[redacted]d presents with Labor Eval  1804 Procardia 20 mg x 1. Given PO hydration   1915 speculum exam indicative of yeast infection  Cervix closed long and high Plan: Discharge to home. Discussed with Dr. Leslie Andrea   #Preterm labor/uterine irritablilty: not feeling contractions after dose of procardia. Most likely associated with yeast infection and mild dehydration. No cervical change over 4 hrs. Unlikely  PTL. Unable to perform FFN because of cervical check at HD. - increase fluid hydration  - return precautions given   #candidal vulvovaginitis: signs of yeast infection on speculum exam. Reported as having a yeast infection while being seen at the health department.  -  Diflucan 150 mg x 1 given   Rosemarie Ax 5/28/20155:53 PM

## 2014-01-01 NOTE — MAU Provider Note (Signed)
Attestation of Attending Supervision of Advanced Practitioner (CNM/NP): Evaluation and management procedures were performed by the Advanced Practitioner under my supervision and collaboration. I have reviewed the Advanced Practitioner's note and chart, and I agree with the management and plan.  Guss Bunde 7:23 PM

## 2014-01-01 NOTE — MAU Note (Signed)
Patient states she had a regular visit at the health department today. Was having contractions but was not dilated. Sent to MAU for evaluation. Patient states she is having mild contractions, no leaking or bleeding and reports good fetal movement.

## 2014-01-22 LAB — OB RESULTS CONSOLE GBS: GBS: NEGATIVE

## 2014-02-07 ENCOUNTER — Inpatient Hospital Stay (HOSPITAL_COMMUNITY)
Admission: AD | Admit: 2014-02-07 | Discharge: 2014-02-10 | DRG: 774 | Disposition: A | Discharge status: Home / Self Care | Payer: Medicaid Other | Source: Ambulatory Visit | Attending: Obstetrics & Gynecology | Admitting: Obstetrics & Gynecology

## 2014-02-07 ENCOUNTER — Encounter (HOSPITAL_COMMUNITY): Payer: Self-pay | Admitting: *Deleted

## 2014-02-07 ENCOUNTER — Encounter (HOSPITAL_COMMUNITY): Payer: Medicaid Other | Admitting: Anesthesiology

## 2014-02-07 ENCOUNTER — Inpatient Hospital Stay (HOSPITAL_COMMUNITY): Payer: Medicaid Other | Admitting: Anesthesiology

## 2014-02-07 DIAGNOSIS — Z833 Family history of diabetes mellitus: Secondary | ICD-10-CM

## 2014-02-07 DIAGNOSIS — Z8249 Family history of ischemic heart disease and other diseases of the circulatory system: Secondary | ICD-10-CM | POA: Diagnosis not present

## 2014-02-07 DIAGNOSIS — IMO0001 Reserved for inherently not codable concepts without codable children: Secondary | ICD-10-CM

## 2014-02-07 DIAGNOSIS — O4290 Premature rupture of membranes, unspecified as to length of time between rupture and onset of labor, unspecified weeks of gestation: Secondary | ICD-10-CM | POA: Diagnosis present

## 2014-02-07 DIAGNOSIS — O99891 Other specified diseases and conditions complicating pregnancy: Secondary | ICD-10-CM | POA: Diagnosis present

## 2014-02-07 DIAGNOSIS — O411090 Infection of amniotic sac and membranes, unspecified, unspecified trimester, not applicable or unspecified: Secondary | ICD-10-CM | POA: Diagnosis present

## 2014-02-07 LAB — COMPREHENSIVE METABOLIC PANEL
ALK PHOS: 260 U/L — AB (ref 39–117)
ALT: 7 U/L (ref 0–35)
AST: 13 U/L (ref 0–37)
Albumin: 2.7 g/dL — ABNORMAL LOW (ref 3.5–5.2)
Anion gap: 13 (ref 5–15)
BILIRUBIN TOTAL: 0.3 mg/dL (ref 0.3–1.2)
BUN: 4 mg/dL — ABNORMAL LOW (ref 6–23)
CHLORIDE: 104 meq/L (ref 96–112)
CO2: 20 meq/L (ref 19–32)
Calcium: 9.3 mg/dL (ref 8.4–10.5)
Creatinine, Ser: 0.41 mg/dL — ABNORMAL LOW (ref 0.50–1.10)
GLUCOSE: 81 mg/dL (ref 70–99)
POTASSIUM: 3.7 meq/L (ref 3.7–5.3)
SODIUM: 137 meq/L (ref 137–147)
Total Protein: 6.4 g/dL (ref 6.0–8.3)

## 2014-02-07 LAB — CBC
HCT: 39.7 % (ref 36.0–46.0)
HEMOGLOBIN: 13.7 g/dL (ref 12.0–15.0)
MCH: 28.7 pg (ref 26.0–34.0)
MCHC: 34.5 g/dL (ref 30.0–36.0)
MCV: 83.1 fL (ref 78.0–100.0)
Platelets: 296 10*3/uL (ref 150–400)
RBC: 4.78 MIL/uL (ref 3.87–5.11)
RDW: 16.5 % — ABNORMAL HIGH (ref 11.5–15.5)
WBC: 8.2 10*3/uL (ref 4.0–10.5)

## 2014-02-07 LAB — RPR

## 2014-02-07 MED ORDER — OXYTOCIN 40 UNITS IN LACTATED RINGERS INFUSION - SIMPLE MED: 62.5000 mL/h | INTRAVENOUS | Status: DC

## 2014-02-07 MED ORDER — LACTATED RINGERS IV SOLN: INTRAVENOUS | Status: DC

## 2014-02-07 MED ORDER — EPHEDRINE 5 MG/ML INJ
10.0000 mg | INTRAVENOUS | Status: DC | PRN
  Filled 2014-02-07: qty 2

## 2014-02-07 MED ORDER — LIDOCAINE HCL (PF) 1 % IJ SOLN
INTRAMUSCULAR | Status: DC | PRN
  Administered 2014-02-07 (×4): 4 mL

## 2014-02-07 MED ORDER — OXYCODONE-ACETAMINOPHEN 5-325 MG PO TABS: 1.0000 | ORAL_TABLET | ORAL | Status: DC | PRN

## 2014-02-07 MED ORDER — LACTATED RINGERS IV SOLN
500.0000 mL | Freq: Once | INTRAVENOUS | Status: AC
  Administered 2014-02-07: 500 mL via INTRAVENOUS

## 2014-02-07 MED ORDER — LACTATED RINGERS IV SOLN
500.0000 mL | INTRAVENOUS | Status: DC | PRN
  Administered 2014-02-08 (×2): 500 mL via INTRAVENOUS

## 2014-02-07 MED ORDER — PHENYLEPHRINE 40 MCG/ML (10ML) SYRINGE FOR IV PUSH (FOR BLOOD PRESSURE SUPPORT)
80.0000 ug | PREFILLED_SYRINGE | INTRAVENOUS | Status: DC | PRN
  Filled 2014-02-07: qty 10
  Filled 2014-02-07: qty 2

## 2014-02-07 MED ORDER — FENTANYL CITRATE 0.05 MG/ML IJ SOLN
100.0000 ug | INTRAMUSCULAR | Status: DC | PRN
  Administered 2014-02-07: 100 ug via INTRAVENOUS
  Filled 2014-02-07: qty 2

## 2014-02-07 MED ORDER — FENTANYL 2.5 MCG/ML BUPIVACAINE 1/10 % EPIDURAL INFUSION (WH - ANES)
14.0000 mL/h | INTRAMUSCULAR | Status: DC | PRN
  Administered 2014-02-07 – 2014-02-08 (×2): 14 mL/h via EPIDURAL
  Filled 2014-02-07 (×2): qty 125

## 2014-02-07 MED ORDER — ACETAMINOPHEN 325 MG PO TABS
650.0000 mg | ORAL_TABLET | ORAL | Status: DC | PRN
  Administered 2014-02-08: 650 mg via ORAL
  Filled 2014-02-07: qty 2

## 2014-02-07 MED ORDER — TERBUTALINE SULFATE 1 MG/ML IJ SOLN: 0.2500 mg | Freq: Once | INTRAMUSCULAR | Status: AC | PRN

## 2014-02-07 MED ORDER — ONDANSETRON HCL 4 MG/2ML IJ SOLN
4.0000 mg | Freq: Four times a day (QID) | INTRAMUSCULAR | Status: DC | PRN
  Administered 2014-02-07: 4 mg via INTRAVENOUS
  Filled 2014-02-07: qty 2

## 2014-02-07 MED ORDER — OXYTOCIN BOLUS FROM INFUSION
500.0000 mL | INTRAVENOUS | Status: DC
  Administered 2014-02-08: 500 mL via INTRAVENOUS

## 2014-02-07 MED ORDER — CITRIC ACID-SODIUM CITRATE 334-500 MG/5ML PO SOLN: 30.0000 mL | ORAL | Status: DC | PRN

## 2014-02-07 MED ORDER — DIPHENHYDRAMINE HCL 50 MG/ML IJ SOLN
12.5000 mg | INTRAMUSCULAR | Status: DC | PRN
  Administered 2014-02-08: 12.5 mg via INTRAVENOUS
  Filled 2014-02-07: qty 1

## 2014-02-07 MED ORDER — PHENYLEPHRINE 40 MCG/ML (10ML) SYRINGE FOR IV PUSH (FOR BLOOD PRESSURE SUPPORT)
80.0000 ug | PREFILLED_SYRINGE | INTRAVENOUS | Status: DC | PRN
  Filled 2014-02-07: qty 2

## 2014-02-07 MED ORDER — LIDOCAINE HCL (PF) 1 % IJ SOLN
30.0000 mL | INTRAMUSCULAR | Status: DC | PRN
  Filled 2014-02-07: qty 30

## 2014-02-07 MED ORDER — IBUPROFEN 600 MG PO TABS
600.0000 mg | ORAL_TABLET | Freq: Four times a day (QID) | ORAL | Status: DC | PRN
  Administered 2014-02-08: 600 mg via ORAL
  Filled 2014-02-07: qty 1

## 2014-02-07 MED ORDER — OXYTOCIN 40 UNITS IN LACTATED RINGERS INFUSION - SIMPLE MED
1.0000 m[IU]/min | INTRAVENOUS | Status: DC
  Administered 2014-02-07: 2 m[IU]/min via INTRAVENOUS
  Filled 2014-02-07: qty 1000

## 2014-02-07 NOTE — Anesthesia Preprocedure Evaluation (Signed)
Anesthesia Evaluation  Patient identified by MRN, date of birth, ID band Patient awake    Reviewed: Allergy & Precautions, H&P , NPO status , Patient's Chart, lab work & pertinent test results, reviewed documented beta blocker date and time   History of Anesthesia Complications Negative for: history of anesthetic complications  Airway Mallampati: I TM Distance: >3 FB Neck ROM: full    Dental  (+) Teeth Intact   Pulmonary neg pulmonary ROS,  breath sounds clear to auscultation        Cardiovascular negative cardio ROS  Rhythm:regular Rate:Normal     Neuro/Psych negative neurological ROS  negative psych ROS   GI/Hepatic negative GI ROS, Neg liver ROS,   Endo/Other  negative endocrine ROS  Renal/GU negative Renal ROS     Musculoskeletal   Abdominal   Peds  Hematology negative hematology ROS (+)   Anesthesia Other Findings   Reproductive/Obstetrics (+) Pregnancy                           Anesthesia Physical Anesthesia Plan  ASA: II  Anesthesia Plan: Epidural   Post-op Pain Management:    Induction:   Airway Management Planned:   Additional Equipment:   Intra-op Plan:   Post-operative Plan:   Informed Consent: I have reviewed the patients History and Physical, chart, labs and discussed the procedure including the risks, benefits and alternatives for the proposed anesthesia with the patient or authorized representative who has indicated his/her understanding and acceptance.     Plan Discussed with:   Anesthesia Plan Comments:         Anesthesia Quick Evaluation

## 2014-02-07 NOTE — MAU Note (Signed)
Pt stated she was laying down and felt a pop and stood up and fluid started leaking out  Fluid is yellow/green in color. C/o mild mestral like cramping as well/ pt stated her b/p has been elevated the past few days as well and has swelling in her feet.

## 2014-02-07 NOTE — Progress Notes (Signed)
Patient refusing to wear yellow socks post epidural at this time

## 2014-02-07 NOTE — Progress Notes (Signed)
Purva Kleist is a 23 y.o. G1P0000 at 39w1 admitted for SROM  Subjective: Feeling a lot more pressure, esp in vaginal region Foley bulb resting in vagina  Objective: BP 111/63  Pulse 94  Temp(Src) 98.1 F (36.7 C) (Oral)  Resp 18  Ht 5\' 2"  (1.575 m)  Wt 76.658 kg (169 lb)  BMI 30.90 kg/m2  SpO2 99%  LMP 05/09/2013      FHT:  FHR: 120 bpm, variability: moderate,  accelerations:  Present,  decelerations:  Absent UC:   regular, every 2-5 minutes SVE:   Dilation: 1.5 Effacement (%): 80 Station: -2 Exam by:: Doree Fudge, CNM  Labs: Lab Results  Component Value Date   WBC 8.2 02/07/2014   HGB 13.7 02/07/2014   HCT 39.7 02/07/2014   MCV 83.1 02/07/2014   PLT 296 02/07/2014    Assessment / Plan: Spontaneous labor, progressing normally  Labor: Progressing normally,s/p removal of foley, will start pit at 2X2 Preeclampsia:  sending pr/cr ratio for prev elevated pressures Fetal Wellbeing:  Category I Pain Control:  Epidural, fent prn I/D:  n/a Anticipated MOD:  NSVD  Langston Masker 02/07/2014, 8:15 PM

## 2014-02-07 NOTE — Anesthesia Procedure Notes (Signed)
Epidural Patient location during procedure: OB Start time: 02/07/2014 8:58 PM  Staffing Performed by: anesthesiologist   Preanesthetic Checklist Completed: patient identified, site marked, surgical consent, pre-op evaluation, timeout performed, IV checked, risks and benefits discussed and monitors and equipment checked  Epidural Patient position: sitting Prep: site prepped and draped and DuraPrep Patient monitoring: continuous pulse ox and blood pressure Approach: midline Injection technique: LOR air  Needle:  Needle type: Tuohy  Needle gauge: 17 G Needle length: 9 cm and 9 Needle insertion depth: 5 cm cm Catheter type: closed end flexible Catheter size: 19 Gauge Catheter at skin depth: 10 cm Test dose: negative  Assessment Events: blood not aspirated, injection not painful, no injection resistance, negative IV test and no paresthesia  Additional Notes Discussed risk of headache, infection, bleeding, nerve injury and failed or incomplete block.  Patient voices understanding and wishes to proceed.  Epidural placed easily on first attempt.  No paresthesia.  Patient tolerated procedure well with no apparent complications.  Charlton Haws, MDReason for block:procedure for pain

## 2014-02-07 NOTE — H&P (Signed)
Trinidy Rudnick is a 23 y.o. female G1P0000 with IUP at [redacted]w[redacted]d presenting for SROM. Pt states she has been having irregular, every 5 minutes contractions, associated with scant staining vaginal bleeding.  Membranes are ruptured, with active fetal movement.   PNCare at HD since 12 wks  Prenatal History/Complications: Caryssa Elzey is a 23 y.o. female G1P0000 with IUP at [redacted]w[redacted]d presenting for SROM. She experienced SROM around 1 hour ago at home while watching tv. Fluid was meconium stained. She has had mildly elevated blood pressures at HD [facility] visit earlier this week. She denies headache, vision changes, though she has some mild swelling of her lower extremities. She denies hx of STD's, or substance abuse. She has no other medical problems.   Past Medical History: Past Medical History  Diagnosis Date  . Medical history non-contributory     Past Surgical History: Past Surgical History  Procedure Laterality Date  . No past surgeries      Obstetrical History: OB History   Grav Para Term Preterm Abortions TAB SAB Ect Mult Living   1 0 0 0 0 0 0 0 0 0       Gynecological History: OB History   Grav Para Term Preterm Abortions TAB SAB Ect Mult Living   1 0 0 0 0 0 0 0 0 0       Social History: History   Social History  . Marital Status: Single    Spouse Name: N/A    Number of Children: N/A  . Years of Education: N/A   Social History Main Topics  . Smoking status: Never Smoker   . Smokeless tobacco: None  . Alcohol Use: No  . Drug Use: No  . Sexual Activity: None   Other Topics Concern  . None   Social History Narrative  . None    Family History: Family History  Problem Relation Age of Onset  . Hypertension Father   . Diabetes Maternal Grandmother   . Diabetes Paternal Grandmother     Allergies: No Known Allergies  Prescriptions prior to admission  Medication Sig Dispense Refill  . Prenatal Vit-Fe Fumarate-FA (PRENATAL MULTIVITAMIN) TABS tablet Take 1 tablet by  mouth daily at 12 noon.         Review of Systems   Constitutional: per HPI above.   Blood pressure 131/97, pulse 107, temperature 98.1 F (36.7 C), temperature source Oral, resp. rate 20, height 5\' 2"  (1.575 m), weight 76.658 kg (169 lb), last menstrual period 05/09/2013, SpO2 99.00%. General appearance: alert, cooperative and no distress Lungs: clear to auscultation bilaterally Heart: regular rate and rhythm Abdomen: soft, non-tender; bowel sounds normal Pelvic: 1.5, 69%, -2 Extremities: Homans sign is negative, no sign of DVT Grossly neurologically intact Presentation: cephalic Fetal monitoringBaseline: 140's bpm, Variability: Good {> 6 bpm), Accelerations: Reactive and Decelerations: Variable: mild Uterine activityFrequency: Every 5 minutes and Duration: 15seconds     Prenatal labs: ABO, Rh:  O pos Antibody:  Neg Rubella:  Immune RPR:   Neg HBsAg:   Neg HIV:   Unreactive GBS:   Neg 1 hr Glucola - Neg Genetic screening - Neg Anatomy US - Normal female.    Prenatal Transfer Tool  Maternal Diabetes: No Genetic Screening: Normal Maternal Ultrasounds/Referrals: Normal Fetal Ultrasounds or other Referrals:  None Maternal Substance Abuse:  No Significant Maternal Medications:  None Significant Maternal Lab Results: Lab values include: Group B Strep negative     No results found for this or any previous visit (from the past 24  hour(s)).  Assessment: Mattie Marcinek is a 23 y.o. G1P0000 at [redacted]w[redacted]d by L=12 w u/s here for SROM and active labor.  #Labor: Foley Bulb in place with plan to advance to pit as needed.  #Pain: Fentanyl, epidural  #FWB: Cat II #ID:  No ppx needed currently.  #MOF: Breast #MOC: OCP #Circ:  None  Melancon, Caleb G 02/07/2014, 4:33 PM   I spoke with and examined patient and agree with resident/PA/SNM's note and plan of care.  Cervical foley bulb inserted and inflated w/ 57ml LR w/o difficulty. Will start pitocin once out, if needed.  Mod MSF-  plan to have NICU team at birth.  DTRs 2+, no clonus, trace BLE edema. Will get pre-e labs and monitor bp's.   Roma Schanz, CNM, [facility] 02/07/2014 5:25 PM

## 2014-02-08 ENCOUNTER — Encounter (HOSPITAL_COMMUNITY): Payer: Self-pay | Admitting: *Deleted

## 2014-02-08 DIAGNOSIS — O4290 Premature rupture of membranes, unspecified as to length of time between rupture and onset of labor, unspecified weeks of gestation: Secondary | ICD-10-CM

## 2014-02-08 DIAGNOSIS — O411090 Infection of amniotic sac and membranes, unspecified, unspecified trimester, not applicable or unspecified: Secondary | ICD-10-CM

## 2014-02-08 LAB — CBC
HCT: 39.4 % (ref 36.0–46.0)
Hemoglobin: 13.7 g/dL (ref 12.0–15.0)
MCH: 28.2 pg (ref 26.0–34.0)
MCHC: 34.8 g/dL (ref 30.0–36.0)
MCV: 81.2 fL (ref 78.0–100.0)
PLATELETS: 279 10*3/uL (ref 150–400)
RBC: 4.85 MIL/uL (ref 3.87–5.11)
RDW: 16.9 % — AB (ref 11.5–15.5)
WBC: 17.3 10*3/uL — AB (ref 4.0–10.5)

## 2014-02-08 LAB — PROTEIN / CREATININE RATIO, URINE
Creatinine, Urine: 68.62 mg/dL
Protein Creatinine Ratio: 0.41 — ABNORMAL HIGH (ref 0.00–0.15)
Total Protein, Urine: 27.8 mg/dL

## 2014-02-08 LAB — MRSA PCR SCREENING: MRSA by PCR: NEGATIVE

## 2014-02-08 MED ORDER — GENTAMICIN SULFATE 40 MG/ML IJ SOLN
160.0000 mg | Freq: Once | INTRAVENOUS | Status: AC
  Administered 2014-02-08: 160 mg via INTRAVENOUS
  Filled 2014-02-08: qty 4

## 2014-02-08 MED ORDER — SENNOSIDES-DOCUSATE SODIUM 8.6-50 MG PO TABS
2.0000 | ORAL_TABLET | ORAL | Status: DC
Start: 2014-02-09 — End: 2014-02-10
  Administered 2014-02-08: 2 via ORAL
  Filled 2014-02-08 (×2): qty 2

## 2014-02-08 MED ORDER — WITCH HAZEL-GLYCERIN EX PADS: 1.0000 "application " | MEDICATED_PAD | CUTANEOUS | Status: DC | PRN

## 2014-02-08 MED ORDER — BENZOCAINE-MENTHOL 20-0.5 % EX AERO: 1.0000 "application " | INHALATION_SPRAY | CUTANEOUS | Status: DC | PRN

## 2014-02-08 MED ORDER — DIPHENHYDRAMINE HCL 25 MG PO CAPS: 25.0000 mg | ORAL_CAPSULE | Freq: Four times a day (QID) | ORAL | Status: DC | PRN

## 2014-02-08 MED ORDER — LANOLIN HYDROUS EX OINT: TOPICAL_OINTMENT | CUTANEOUS | Status: DC | PRN

## 2014-02-08 MED ORDER — SODIUM CHLORIDE 0.9 % IV SOLN
2.0000 g | Freq: Four times a day (QID) | INTRAVENOUS | Status: DC
  Administered 2014-02-08: 2 g via INTRAVENOUS
  Filled 2014-02-08 (×2): qty 2000

## 2014-02-08 MED ORDER — MAGNESIUM SULFATE BOLUS VIA INFUSION
4.0000 g | Freq: Once | INTRAVENOUS | Status: AC
  Administered 2014-02-08: 4 g via INTRAVENOUS
  Filled 2014-02-08: qty 500

## 2014-02-08 MED ORDER — GENTAMICIN SULFATE 40 MG/ML IJ SOLN
150.0000 mg | Freq: Three times a day (TID) | INTRAVENOUS | Status: DC
  Filled 2014-02-08: qty 3.75

## 2014-02-08 MED ORDER — MAGNESIUM SULFATE 40 G IN LACTATED RINGERS - SIMPLE
2.0000 g/h | INTRAVENOUS | Status: AC
  Filled 2014-02-08 (×2): qty 500

## 2014-02-08 MED ORDER — OXYCODONE-ACETAMINOPHEN 5-325 MG PO TABS
1.0000 | ORAL_TABLET | ORAL | Status: DC | PRN
  Administered 2014-02-08: 1 via ORAL
  Filled 2014-02-08: qty 1

## 2014-02-08 MED ORDER — IBUPROFEN 600 MG PO TABS
600.0000 mg | ORAL_TABLET | Freq: Four times a day (QID) | ORAL | Status: DC
  Administered 2014-02-08 – 2014-02-09 (×7): 600 mg via ORAL
  Filled 2014-02-08 (×8): qty 1

## 2014-02-08 MED ORDER — ONDANSETRON HCL 4 MG/2ML IJ SOLN: 4.0000 mg | INTRAMUSCULAR | Status: DC | PRN

## 2014-02-08 MED ORDER — TETANUS-DIPHTH-ACELL PERTUSSIS 5-2.5-18.5 LF-MCG/0.5 IM SUSP: 0.5000 mL | Freq: Once | INTRAMUSCULAR | Status: DC

## 2014-02-08 MED ORDER — SIMETHICONE 80 MG PO CHEW: 80.0000 mg | CHEWABLE_TABLET | ORAL | Status: DC | PRN

## 2014-02-08 MED ORDER — DIBUCAINE 1 % RE OINT: 1.0000 "application " | TOPICAL_OINTMENT | RECTAL | Status: DC | PRN

## 2014-02-08 MED ORDER — ONDANSETRON HCL 4 MG PO TABS: 4.0000 mg | ORAL_TABLET | ORAL | Status: DC | PRN

## 2014-02-08 MED ORDER — ZOLPIDEM TARTRATE 5 MG PO TABS: 5.0000 mg | ORAL_TABLET | Freq: Every evening | ORAL | Status: DC | PRN

## 2014-02-08 MED ORDER — PRENATAL MULTIVITAMIN CH
1.0000 | ORAL_TABLET | Freq: Every day | ORAL | Status: DC
  Administered 2014-02-08 – 2014-02-09 (×2): 1 via ORAL
  Filled 2014-02-08 (×2): qty 1

## 2014-02-08 MED ORDER — LACTATED RINGERS IV SOLN
INTRAVENOUS | Status: DC
  Administered 2014-02-08 – 2014-02-09 (×3): via INTRAVENOUS

## 2014-02-08 NOTE — Progress Notes (Signed)
Gloria Adams is a 23 y.o. G1P0000 at 39w1 admitted for SROM  Subjective: Feels more pressure Re-positioning on side   Objective: BP 132/83  Pulse 103  Temp(Src) 98.4 F (36.9 C) (Oral)  Resp 18  Ht 5\' 2"  (1.575 m)  Wt 76.658 kg (169 lb)  BMI 30.90 kg/m2  SpO2 100%  LMP 05/09/2013   Total I/O In: -  Out: 300 [Emesis/NG output:300]  FHT:  FHR: 150 bpm, variability: moderate,  accelerations:  Present,  decelerations:  Prolonged, late  UC:   regular, every 2-5 minutes SVE:   Dilation: 7.5 Effacement (%): 80 Station: 0 Exam by:: Dr. Skeet Simmer  Labs: Lab Results  Component Value Date   WBC 8.2 02/07/2014   HGB 13.7 02/07/2014   HCT 39.7 02/07/2014   MCV 83.1 02/07/2014   PLT 296 02/07/2014    Assessment / Plan: Spontaneous labor, progressing normally  Labor: Progressing normally, Preeclampsia:  Elevated ratio; nml range pressures, asymptomatic  Fetal Wellbeing:  Category II, will reposition and given bolus, monitor closely for recurrent lates Pain Control:  Epidural, fent prn I/D:  n/a Anticipated MOD:  NSVD  Langston Masker 02/08/2014, 2:33 AM

## 2014-02-08 NOTE — Progress Notes (Signed)
ANTIBIOTIC CONSULT NOTE - INITIAL  Pharmacy Consult for Gentamicin Indication: Chorioamnionitis  No Known Allergies  Patient Measurements: Height: 5\' 2"  (157.5 cm) Weight: 169 lb (76.658 kg) IBW/kg (Calculated) : 50.1 Adjusted Body Weight:  58.1 kg  Vital Signs: Temp: 100.4 F (38 C) (07/05 0524) Temp src: Oral (07/05 0524) BP: 127/85 mmHg (07/05 0530) Pulse Rate: 101 (07/05 0530) Intake/Output from previous day: 07/04 0701 - 07/05 0700 In: -  Out: 300 [Emesis/NG output:300] Intake/Output from this shift: Total I/O In: -  Out: 300 [Emesis/NG output:300]  Labs:  Recent Labs  02/07/14 1610 02/07/14 2343  WBC 8.2  --   HGB 13.7  --   PLT 296  --   LABCREA  --  68.62  CREATININE 0.41*  --    Estimated Creatinine Clearance: 107.5 ml/min (by C-G formula based on Cr of 0.41). No results found for this basename: VANCOTROUGH, Corlis Leak, VANCORANDOM, GENTTROUGH, GENTPEAK, GENTRANDOM, TOBRATROUGH, TOBRAPEAK, TOBRARND, AMIKACINPEAK, AMIKACINTROU, AMIKACIN,  in the last 72 hours   Microbiology: Recent Results (from the past 720 hour(s))  OB RESULTS CONSOLE GBS     Status: None   Collection Time    01/22/14 12:00 AM      Result Value Ref Range Status   GBS Negative   Final    Medical History: Past Medical History  Diagnosis Date  . Medical history non-contributory     Medications:  Ampicillin 2 Gm IV every 6 hour  Assessment: 23 yo G1P0000 admitted at [redacted]w[redacted]d for SROM; now with increased temperature and presumed chorioamnionitis.  Goal of Therapy:  Gentamicin  Peaks 6-8 mcg/ml; troughs < 1 mcg/ml  Plan:  Gentamicin 160 mg IV loading dose; then gentamicin 150 mg IV every 8 hours. Serum gentamicin levels as indicated by duration of therapy and clinical response.   Norberto Sorenson 02/08/2014,6:08 AM

## 2014-02-08 NOTE — Progress Notes (Signed)
Patient transferred to [facility] for magnesium via wheelchair without incident.

## 2014-02-08 NOTE — Anesthesia Postprocedure Evaluation (Signed)
  Anesthesia Post-op Note  Patient: Gloria Adams  Procedure(s) Performed: * No procedures listed *  Patient Location: A-ICU  Anesthesia Type:Epidural  Level of Consciousness: awake  Airway and Oxygen Therapy: Patient Spontanous Breathing  Post-op Pain: mild  Post-op Assessment: Patient's Cardiovascular Status Stable and Respiratory Function Stable  Post-op Vital Signs: stable  Last Vitals:  Filed Vitals:   02/08/14 1420  BP: 115/73  Pulse: 90  Temp:   Resp: 18    Complications: No apparent anesthesia complications

## 2014-02-09 MED ORDER — SODIUM CHLORIDE 0.9 % IJ SOLN: 3.0000 mL | INTRAMUSCULAR | Status: DC | PRN

## 2014-02-09 MED ORDER — SODIUM CHLORIDE 0.9 % IJ SOLN
3.0000 mL | Freq: Two times a day (BID) | INTRAMUSCULAR | Status: DC
  Administered 2014-02-09 – 2014-02-10 (×3): 3 mL via INTRAVENOUS

## 2014-02-09 NOTE — Lactation Note (Addendum)
This note was copied from the chart of [name]. Lactation Consultation Note     Follow up consult with this mom and baby, in the NICU. Baby is term, has been bottle fed formula this morning, and is breast feeding for the first time. He latched quickly, but was not able to maintain latch. He has a bowl shaped tongue with elevation, and a possible short frenulum. He can easily extend his tongue over his bottom lip, but mom 's nipple was very pinched and uncomfortable, after a minute of breast feeding. Mom also has very round, full breasts, with short shafted nipples. I fitted mom with a 20 nipple shield, and was able to get baby latched, mom reporting no discomfort. He was actively sucking for at least 15 minutes, with visible swallows. Mom has easily expressed colostrum.Colostrum seen in shield after feed.  I will work with mom on getting her to independently applying shield. Basic breast feeding teaching done with mom at this time.  Patient Name: Gloria Adams Today's Date: 02/09/2014 Reason for consult: Initial assessment;NICU baby   Maternal Data Formula Feeding for Exclusion: Yes (mom in AICU and baby in NICU) Reason for exclusion: Admission to Intensive Care Unit (ICU) post-partum Infant to breast within first hour of birth: No Breastfeeding delayed due to:: Infant status Has patient been taught Hand Expression?: Yes Does the patient have breastfeeding experience prior to this delivery?: No  Feeding Feeding Type: Breast Fed Nipple Type: Slow - flow (Green) Length of feed: 15 min  LATCH Score/Interventions Latch: Repeated attempts needed to sustain latch, nipple held in mouth throughout feeding, stimulation needed to elicit sucking reflex. (possible tight frenulum, nipple pinching, short shafted nipple - 20 NS used with good results) Intervention(s): Adjust position;Assist with latch;Breast massage;Breast compression  Audible Swallowing: A few with stimulation (visible  swallows, lots of easily expressed colostrum) Intervention(s): Skin to skin;Hand expression  Type of Nipple: Everted at rest and after stimulation  Comfort (Breast/Nipple): Soft / non-tender     Hold (Positioning): Assistance needed to correctly position infant at breast and maintain latch. Intervention(s): Breastfeeding basics reviewed;Support Pillows;Position options;Skin to skin  LATCH Score: 7  Lactation Tools Discussed/Used Tools: Nipple Shields Nipple shield size: 20 Breast pump type: Double-Electric Breast Pump WIC Program: Yes (mom to call to add baby and make them aware she has a baby in NICU) Pump Review: Setup, frequency, and cleaning;Milk Storage;Other (comment) (premie setting, hand expression) Initiated by:: bedside RN at 10 hours post partum Date initiated:: 02/08/14   Consult Status Consult Status: PRN Date: 02/10/14 Follow-up type: In-patient (NICU)    Tonna Corner 02/09/2014, 12:00 PM

## 2014-02-09 NOTE — Progress Notes (Signed)
Pt returned from NICU & then transferred to [place] #[id number]. AICU RN will continue to be assigned to pt this afternoon.

## 2014-02-09 NOTE — Progress Notes (Signed)
Post Partum Day #1 NSVD, preE Subjective: no complaints, up ad lib, voiding and tolerating PO. Baby stable in NICU. WOuld like to go home in am.  Pumping with assistance from [facility].   Objective: Blood pressure 128/81, pulse 95, temperature 97.7 F (36.5 C), temperature source Oral, resp. rate 18, height 5\' 2"  (1.575 m), weight 74.934 kg (165 lb 3.2 oz), last menstrual period 05/09/2013, SpO2 100.00%, unknown if currently breastfeeding.  Physical Exam:  General: alert, cooperative and no distress Lochia: appropriate Uterine Fundus: firm Incision: n/a DVT Evaluation: No evidence of DVT seen on physical exam.   Recent Labs  02/07/14 1610 02/08/14 0738  HGB 13.7 13.7  HCT 39.7 39.4    Assessment/Plan: Plan for discharge tomorrow   LOS: 2 days   Gloria Adams 02/09/2014, 2:29 PM

## 2014-02-09 NOTE — Lactation Note (Signed)
This note was copied from the chart of [place]. Lactation Consultation Note    Initial consult with this mom of a term NICU baby, doing well, in NICU for r/o sepsis. Mom will be transferred from AICU in 1 hours, and is also doing well. She started pumping yesterday, and is expressing up to 5 mls of colostrum. Mom has not ben doing hand expression, so I explained the advantage of adding this, to increasing her supply. Mom c/o some soreness with pumping on right breast. i noted skin residue in the 24 flanges she had used, and suggested she increase to 17 flanges. I also told mom that i would lkie to assist her with latching Skyler today. She is going to the NICU, and will have her baby's nurse call me, when he next feeds. Mom is active with WIC, and will call to ask for DEP.  Patient Name: Boy Amayra Kiedrowski Today's Date: 02/09/2014 Reason for consult: Initial assessment;NICU baby   Maternal Data Formula Feeding for Exclusion: Yes (mom in AICU and baby in NICU) Reason for exclusion: Admission to Intensive Care Unit (ICU) post-partum Infant to breast within first hour of birth: No Breastfeeding delayed due to:: Infant status Has patient been taught Hand Expression?: Yes Does the patient have breastfeeding experience prior to this delivery?: No  Feeding    LATCH Score/Interventions                      Lactation Tools Discussed/Used Tools: Pump Breast pump type: Double-Electric Breast Pump WIC Program: Yes (mom to call to add baby and make them aware she has a baby in NICU) Pump Review: Setup, frequency, and cleaning;Milk Storage;Other (comment) (premie setting, hand expression) Initiated by:: bedside RN at 10 hours post partum Date initiated:: 02/08/14   Consult Status Consult Status: Follow-up Date: 02/10/14 Follow-up type: In-patient    Tonna Corner 02/09/2014, 10:26 AM

## 2014-02-09 NOTE — Progress Notes (Signed)
Ur chart review completed.  

## 2014-02-10 MED ORDER — IBUPROFEN 600 MG PO TABS: 600.0000 mg | ORAL_TABLET | Freq: Four times a day (QID) | ORAL | Status: DC

## 2014-02-10 MED ORDER — NORETHINDRONE 0.35 MG PO TABS: 1.0000 | ORAL_TABLET | Freq: Every day | ORAL | Status: DC

## 2014-02-10 NOTE — Lactation Note (Signed)
This note was copied from the chart of [place]. Lactation Consultation Note   Follow up consult with this mom of a term baby in NICU, now 32 hours post partum. Mom has been latching baby with cues, and then supplementing with bottle - EBM/formula. Mom applied nipple shiled with little help this time, and is latching baby independently. He has strong, rhythmic suckles, and ti taking about 40 mls supplement pc. I will continue to follow this mom and baby in the NICU.  Patient Name: Gloria Adams [id number] Date: 02/10/2014     Maternal Data    Feeding Feeding Type: Breast Fed Length of feed: 40 min  LATCH Score/Interventions                      Lactation Tools Discussed/Used     Consult Status      Gloria Adams 02/10/2014, 3:13 PM

## 2014-02-10 NOTE — Discharge Summary (Signed)
Obstetric Discharge Summary Reason for Admission: induction of labor for PROM Prenatal Procedures: NST Intrapartum Procedures: spontaneous vaginal delivery Postpartum Procedures: increased blood pressures and elevated prot-cr ratio. required Mag for 24 hours postpartum Complications-Operative and Postpartum: postpartum magnesium Hemoglobin  Date Value Ref Range Status  02/08/2014 13.7  12.0 - 15.0 g/dL Final     HCT  Date Value Ref Range Status  02/08/2014 39.4  36.0 - 46.0 % Final   Pt presented with PROM and was augmented to deliver a liveborn female. During labor her pressures became elevated. Prot/cr ratio of 0.41 and she was started on Magnesium right around the time of delivery and for 24 hours pp.  Her BP returned rapidly to normal and she denied symptoms of preeclampsia. She is breast feeding and on micronor for contraception now with plans to change to cOCPs in the future.    Physical Exam:  General: alert, cooperative and no distress Lochia: appropriate Uterine Fundus: firm Incision: na DVT Evaluation: No evidence of DVT seen on physical exam. No cords or calf tenderness. No significant calf/ankle edema.  Discharge Diagnoses: Term Pregnancy-delivered and Preelampsia  Discharge Information: Date: 02/10/2014 Activity: pelvic rest Diet: routine Medications: PNV and Ibuprofen Condition: stable Instructions: refer to practice specific booklet Discharge to: home Follow-up Information   Follow up with [facility]. Schedule an appointment as soon as possible for a visit in 6 weeks. (for postpartum visit and to change your birth control)    Contact information:   [place] [address] [phone]      Newborn Data: Live born female  Birth Weight: 7 lb 11.5 oz (3501 g) APGAR: 3, 6  Home with mother.  Gloria Adams L 02/10/2014, 7:42 AM

## 2014-02-10 NOTE — Lactation Note (Signed)
This note was copied from the chart of [place]. Lactation Consultation Note     Follow up consult with this mom of a term baby, now 57 hours old. Mom has not been pumping every 3 hours today, since she has been breast feeding. She is very full, with knots of milk. I explained the importance of pumping, even though she is breast feeding, since she does not have her baby with her. I had her pump, with massage, and she had a steady styream of milk expressing from each breast. I loaned mom a [employer] DEP and instructed her in it's use. Discharge teaching on breast care, engorgement done. I gave mom comfort gels, and instructed her in their use. Her flanges seem to fit well, and her suction seems not too high. She has the beginnings of a crack on the base of her left niple. i advsied EBNM and comfort gels. i will follow this mom and baby in the NICU.   Patient Name: Gloria Adams Date: 02/10/2014 Reason for consult: Follow-up assessment;NICU baby   Maternal Data    Feeding Feeding Type: Formula Nipple Type: Slow - flow Length of feed: 15 min  LATCH Score/Interventions          Comfort (Breast/Nipple): Filling, red/small blisters or bruises, mild/mod discomfort  Problem noted: Filling;Cracked, bleeding, blisters, bruises;Mild/Moderate discomfort (crack at base of left nipple - white, no bleeding) Interventions  (Cracked/bleeding/bruising/blister): Expressed breast milk to nipple Interventions (Mild/moderate discomfort): Comfort gels;Hand expression        Lactation Tools Discussed/Used WIC Program: Yes Pump Review: Setup, frequency, and cleaning   Consult Status Consult Status: PRN Follow-up type: In-patient (NICU)    Tonna Corner 02/10/2014, 6:15 PM

## 2014-02-10 NOTE — Progress Notes (Signed)
Pt discharged home... Condition stable... No equipment... Pt going home via taxi.... Ambulated to car with L. Graylon Good, NT.

## 2014-02-10 NOTE — Discharge Instructions (Signed)
Vaginal Delivery Care After Refer to this sheet in the next few weeks. These discharge instructions provide you with information on caring for yourself after delivery. Your caregiver may also give you specific instructions. Your treatment has been planned according to the most current medical practices available, but problems sometimes occur. Call your caregiver if you have any problems or questions after you go home. HOME CARE INSTRUCTIONS  Take over-the-counter or prescription medicines only as directed by your caregiver or pharmacist.  Do not drink alcohol, especially if you are breastfeeding or taking medicine to relieve pain.  Do not chew or smoke tobacco.  Do not use illegal drugs.  Continue to use good perineal care. Good perineal care includes:  Wiping your perineum from front to back.  Keeping your perineum clean.  Do not use tampons or douche until your caregiver says it is okay.  Shower, wash your hair, and take tub baths as directed by your caregiver.  Wear a well-fitting bra that provides breast support.  Eat healthy foods.  Drink enough fluids to keep your urine clear or pale yellow.  Eat high-fiber foods such as whole grain cereals and breads, brown rice, beans, and fresh fruits and vegetables every day. These foods may help prevent or relieve constipation.  Follow your cargiver's recommendations regarding resumption of activities such as climbing stairs, driving, lifting, exercising, or traveling.  Talk to your caregiver about resuming sexual activities. Resumption of sexual activities is dependent upon your risk of infection, your rate of healing, and your comfort and desire to resume sexual activity.  Try to have someone help you with your household activities and your newborn for at least a few days after you leave the hospital.  Rest as much as possible. Try to rest or take a nap when your newborn is sleeping.  Increase your activities gradually.  Keep all  of your scheduled postpartum appointments. It is very important to keep your scheduled follow-up appointments. At these appointments, your caregiver will be checking to make sure that you are healing physically and emotionally. SEEK MEDICAL CARE IF:   You are passing large clots from your vagina. Save any clots to show your caregiver.  You have a foul smelling discharge from your vagina.  You have trouble urinating.  You are urinating frequently.  You have pain when you urinate.  You have a change in your bowel movements.  You have increasing redness, pain, or swelling near your vaginal incision (episiotomy) or vaginal tear.  You have pus draining from your episiotomy or vaginal tear.  Your episiotomy or vaginal tear is separating.  You have painful, hard, or reddened breasts.  You have a severe headache.  You have blurred vision or see spots.  You feel sad or depressed.  You have thoughts of hurting yourself or your newborn.  You have questions about your care, the care of your newborn, or medicines.  You are dizzy or lightheaded.  You have a rash.  You have nausea or vomiting.  You were breastfeeding and have not had a menstrual period within 12 weeks after you stopped breastfeeding.  You are not breastfeeding and have not had a menstrual period by the 12th week after delivery.  You have a fever. SEEK IMMEDIATE MEDICAL CARE IF:   You have persistent pain.  You have chest pain.  You have shortness of breath.  You faint.  You have leg pain.  You have stomach pain.  Your vaginal bleeding saturates two or more sanitary pads  in 1 hour. MAKE SURE YOU:   Understand these instructions.  Will watch your condition.  Will get help right away if you are not doing well or get worse. Document Released: 07/21/2000 Document Revised: 04/17/2012 Document Reviewed: 03/20/2012 [facility] Patient Information 2015 [place], [place]. This information is not intended to  replace advice given to you by your health care provider. Make sure you discuss any questions you have with your health care provider.

## 2014-02-12 ENCOUNTER — Ambulatory Visit: Payer: Self-pay

## 2014-02-12 NOTE — Lactation Note (Signed)
This note was copied from the chart of [place]. Lactation Consultation Note Mom is severely engorged! Lt. Breast very hard red about 3 inches away from nipple, warm to touch. ICE bags applied, hand massage. DEBP. Rt. Engorged as well but not as bad as LT. Difficulty getting milk out so engorged. Mom stated she just got her pump from [facility] today. Has only pumped 3 times and for 5 minutes d/t it hurt so bad because the flanges were #27 and to tight. #30 flanges fitted w/lanolin rubbed on inside of flange. Stated felt ok. Mom pumped 3 bottles of milk/colostrum. Rt. Breast mostly soft, Lt. Breast much better and colostrum flowing out better. Encouraged to go to MAU for possible mastitis to breast d/t redness and warmth. Stated if it didn't get better after pumping every 2 hrs she would go tomorrow. Mom also hasn't been taking her Ibuprofen since discharge from hospital. Encouraged her to take it for the inflamation to her breast. Mom is to ICE breast, massage, pump every 2 hrs then after engorgement under control she can pump every 3 hrs. Discussed engorgement and mastitis. Mom stated she didn't feel good, I explained d/t the engorgement, and could be mastitis. Mom stated I'm going to try to pump and take the Ibuprofen, if not better tomorrow going to MAU to be looked at. Patient Name: Gloria Adams Date: 02/12/2014     Maternal Data    Feeding Feeding Type: Breast Milk Nipple Type: Regular Length of feed: 15 min  LATCH Score/Interventions                      Lactation Tools Discussed/Used     Consult Status      Gloria Adams 02/12/2014, 11:32 PM

## 2014-02-16 NOTE — Discharge Summary (Signed)
Attestation of Attending Supervision of Fellow: Evaluation and management procedures were performed by the Fellow under my supervision and collaboration.  I have reviewed the Fellow's note and chart, and I agree with the management and plan.    

## 2014-02-18 ENCOUNTER — Inpatient Hospital Stay (HOSPITAL_COMMUNITY)
Admission: AD | Admit: 2014-02-18 | Discharge: 2014-02-18 | Disposition: A | Discharge status: Home / Self Care | Payer: Medicaid Other | Source: Ambulatory Visit | Attending: Obstetrics & Gynecology | Admitting: Obstetrics & Gynecology

## 2014-02-18 ENCOUNTER — Encounter (HOSPITAL_COMMUNITY): Payer: Self-pay | Admitting: *Deleted

## 2014-02-18 DIAGNOSIS — Z8249 Family history of ischemic heart disease and other diseases of the circulatory system: Secondary | ICD-10-CM | POA: Insufficient documentation

## 2014-02-18 DIAGNOSIS — O99891 Other specified diseases and conditions complicating pregnancy: Secondary | ICD-10-CM | POA: Diagnosis not present

## 2014-02-18 DIAGNOSIS — A499 Bacterial infection, unspecified: Secondary | ICD-10-CM

## 2014-02-18 DIAGNOSIS — O9989 Other specified diseases and conditions complicating pregnancy, childbirth and the puerperium: Principal | ICD-10-CM

## 2014-02-18 DIAGNOSIS — Z833 Family history of diabetes mellitus: Secondary | ICD-10-CM | POA: Diagnosis not present

## 2014-02-18 DIAGNOSIS — B9689 Other specified bacterial agents as the cause of diseases classified elsewhere: Secondary | ICD-10-CM

## 2014-02-18 DIAGNOSIS — L293 Anogenital pruritus, unspecified: Secondary | ICD-10-CM | POA: Diagnosis present

## 2014-02-18 DIAGNOSIS — N898 Other specified noninflammatory disorders of vagina: Secondary | ICD-10-CM | POA: Diagnosis not present

## 2014-02-18 DIAGNOSIS — N760 Acute vaginitis: Secondary | ICD-10-CM

## 2014-02-18 LAB — WET PREP, GENITAL
Trich, Wet Prep: NONE SEEN
YEAST WET PREP: NONE SEEN

## 2014-02-18 MED ORDER — METRONIDAZOLE 500 MG PO TABS: 500.0000 mg | ORAL_TABLET | Freq: Two times a day (BID) | ORAL | Status: DC

## 2014-02-18 NOTE — MAU Provider Note (Signed)
@MAUPATCONTACT @  Chief Complaint:  Vaginal discomfort.  Gloria Adams is  23 y.o. G1P1001 at [redacted]w[redacted]d s/p NSVD 02/08/14 who presents complaining of vaginal itching, malodor and occasional mild discomfort with urination which she first noticed approximately 2-3 days ago. Patient has no complaints of pain.She endorses PMH of bartholin cyst and yeast infection/bacterial infections and feels that her current symptoms are consistent with those previous diagnosis. Patient states that she would usually address these issues at home but felt unsure of how to proceed given her 10 day post-partum status.    She denies fever, chills, N/V, decreased appetite, pelvic/abdominal discomfort, frequency, urgency, dysuria, or vaginal discharge or recent history of vaginal intercourse.  She endorses occasional blood with post void wiping and continues to use 1 pad per day to soak up occasional postpartum spotting.   Obstetrical/Gynecological History: OB History   Grav Para Term Preterm Abortions TAB SAB Ect Mult Living   1 1 1  0 0 0 0 0 0 1     Past Medical History: Past Medical History  Diagnosis Date  . Medical history non-contributory     Past Surgical History: Past Surgical History  Procedure Laterality Date  . No past surgeries      Family History: Family History  Problem Relation Age of Onset  . Hypertension Father   . Diabetes Maternal Grandmother   . Diabetes Paternal Grandmother     Social History: History  Substance Use Topics  . Smoking status: Never Smoker   . Smokeless tobacco: Never Used  . Alcohol Use: No    Allergies: No Known Allergies  Meds:  Prescriptions prior to admission  Medication Sig Dispense Refill  . Prenatal Vit-Fe Fumarate-FA (PRENATAL MULTIVITAMIN) TABS tablet Take 1 tablet by mouth daily.       Marland Kitchen ibuprofen (ADVIL,MOTRIN) 600 MG tablet Take 1 tablet (600 mg total) by mouth every 6 (six) hours.  30 tablet  0  . norethindrone (MICRONOR,CAMILA,ERRIN) 0.35 MG tablet  Take 1 tablet (0.35 mg total) by mouth daily.  1 Package  2    Review of Systems - Per HPI     Physical Exam  Blood pressure 129/77, pulse 69, temperature 98.5 F (36.9 C), temperature source Oral, resp. rate 16, last menstrual period 05/09/2013, unknown if currently breastfeeding. GENERAL: Well-developed, well-nourished female in no acute distress.  LUNGS: Clear to auscultation bilaterally.  HEART: Regular rate and rhythm. ABDOMEN: Soft, nontender, nondistended, non-gravid, fundus firm and non-tender palpated midway between pubic symphysis and umbilicus EXTREMITIES: Nontender, no edema, 2+ distal pulses. GU: 2cm x 2cm cyst noted at 8 o'clock from the introitus. Light pink vaginal discharge noted with vaginal swab.  No speculum or pelvic exam performed due to post partum status    Labs: Results for orders placed during the hospital encounter of 02/18/14 (from the past 24 hour(s))  WET PREP, GENITAL   Collection Time    02/18/14  2:30 PM      Result Value Ref Range   Yeast Wet Prep HPF POC NONE SEEN  NONE SEEN   Trich, Wet Prep NONE SEEN  NONE SEEN   Clue Cells Wet Prep HPF POC FEW (*) NONE SEEN   WBC, Wet Prep HPF POC FEW (*) NONE SEEN   Imaging Studies:  No results found.  Assessment: Gloria Adams is  23 y.o. G1P1001 at [redacted]w[redacted]d presents with focal vaginal discomfort x 3 days and no systemic symptoms.  Plan: D/C home with PO metronidazole   Belva Bertin 7/15/20152:58 PM I  have seen the patient with the resident/student and agree with the above.  Mathis Bud

## 2014-02-18 NOTE — Discharge Instructions (Signed)
Bacterial Vaginosis Bacterial vaginosis is a vaginal infection that occurs when the normal balance of bacteria in the vagina is disrupted. It results from an overgrowth of certain bacteria. This is the most common vaginal infection in women of childbearing age. Treatment is important to prevent complications, especially in pregnant women, as it can cause a premature delivery. CAUSES  Bacterial vaginosis is caused by an increase in harmful bacteria that are normally present in smaller amounts in the vagina. Several different kinds of bacteria can cause bacterial vaginosis. However, the reason that the condition develops is not fully understood. RISK FACTORS Certain activities or behaviors can put you at an increased risk of developing bacterial vaginosis, including:  Having a new sex partner or multiple sex partners.  Douching.  Using an intrauterine device (IUD) for contraception. Women do not get bacterial vaginosis from toilet seats, bedding, swimming pools, or contact with objects around them. SIGNS AND SYMPTOMS  Some women with bacterial vaginosis have no signs or symptoms. Common symptoms include:  Grey vaginal discharge.  A fishlike odor with discharge, especially after sexual intercourse.  Itching or burning of the vagina and vulva.  Burning or pain with urination. DIAGNOSIS  Your health care provider will take a medical history and examine the vagina for signs of bacterial vaginosis. A sample of vaginal fluid may be taken. Your health care provider will look at this sample under a microscope to check for bacteria and abnormal cells. A vaginal pH test may also be done.  TREATMENT  Bacterial vaginosis may be treated with antibiotic medicines. These may be given in the form of a pill or a vaginal cream. A second round of antibiotics may be prescribed if the condition comes back after treatment.  HOME CARE INSTRUCTIONS   Only take over-the-counter or prescription medicines as  directed by your health care provider.  If antibiotic medicine was prescribed, take it as directed. Make sure you finish it even if you start to feel better.  Do not have sex until treatment is completed.  Tell all sexual partners that you have a vaginal infection. They should see their health care provider and be treated if they have problems, such as a mild rash or itching.  Practice safe sex by using condoms and only having one sex partner. SEEK MEDICAL CARE IF:   Your symptoms are not improving after 3 days of treatment.  You have increased discharge or pain.  You have a fever. MAKE SURE YOU:   Understand these instructions.  Will watch your condition.  Will get help right away if you are not doing well or get worse. FOR MORE INFORMATION  Centers for Disease Control and Prevention, Division of STD Prevention: www.cdc.gov/std American Sexual Health Association (ASHA): www.ashastd.org  Document Released: 07/24/2005 Document Revised: 05/14/2013 Document Reviewed: 03/05/2013 [employer] Patient Information 2015 [employer]. This information is not intended to replace advice given to you by your health care provider. Make sure you discuss any questions you have with your health care provider.  

## 2014-02-18 NOTE — MAU Note (Signed)
Vag delivery on 7/5, pt feels she has a yeast infection, states her discharge smells, is itching.  Thinks she might have a bartholin's cyst on R labia.  Scant amount of bleeding.

## 2014-02-19 ENCOUNTER — Ambulatory Visit: Payer: Self-pay

## 2014-02-19 NOTE — Lactation Note (Addendum)
This note was copied from the chart of [place]. Lactation Consultation Note     Follow up consult with this mom of a NICU baby, full term, and post coolling blanket, now 56 days old. Mom has been using a manual pump at home, and was using DEP when she vsited her baby in the NICU. Mom left her pump parts on the bus, so I gave her the parts she needed to continue pumping. I also decreased mom to 24 flanges, which I hope will be a better fit for her. Mom reports sore areolas with pumping. Mom is waiting to hear from [facility] today, to possibly get a DEP today. If not, mom may loan a DEP. Mom has a very good milk supply - this is her 4th baby, and she breast fed all of her other children.   Patient Name: Gloria Adams [id number] Date: 02/19/2014 Reason for consult: Follow-up assessment;NICU baby   Maternal Data    Feeding Feeding Type: Breast Milk Nipple Type: Regular Length of feed: 10 min  LATCH Score/Interventions                      Lactation Tools Discussed/Used     Consult Status Consult Status: PRN Follow-up type: In-patient (NICU)    Tonna Corner 02/19/2014, 2:03 PM

## 2014-02-19 NOTE — MAU Provider Note (Signed)
Attestation of Attending Supervision of Advanced Practitioner (CNM/NP): Evaluation and management procedures were performed by the Advanced Practitioner under my supervision and collaboration.  I have reviewed the Advanced Practitioner's note and chart, and I agree with the management and plan.  HARRAWAY-SMITH, Rasheedah Reis 3:09 PM

## 2014-06-02 ENCOUNTER — Encounter (HOSPITAL_COMMUNITY): Payer: Self-pay | Admitting: *Deleted

## 2014-06-02 ENCOUNTER — Inpatient Hospital Stay (HOSPITAL_COMMUNITY)
Admission: AD | Admit: 2014-06-02 | Discharge: 2014-06-02 | Disposition: A | Discharge status: Home / Self Care | Payer: Medicaid Other | Source: Ambulatory Visit | Attending: Family Medicine | Admitting: Family Medicine

## 2014-06-02 DIAGNOSIS — R109 Unspecified abdominal pain: Secondary | ICD-10-CM | POA: Diagnosis not present

## 2014-06-02 DIAGNOSIS — Z202 Contact with and (suspected) exposure to infections with a predominantly sexual mode of transmission: Secondary | ICD-10-CM

## 2014-06-02 HISTORY — DX: Gestational (pregnancy-induced) hypertension without significant proteinuria, unspecified trimester: O13.9

## 2014-06-02 LAB — WET PREP, GENITAL
CLUE CELLS WET PREP: NONE SEEN
Trich, Wet Prep: NONE SEEN
Yeast Wet Prep HPF POC: NONE SEEN

## 2014-06-02 LAB — HIV ANTIBODY (ROUTINE TESTING W REFLEX): HIV: NONREACTIVE

## 2014-06-02 LAB — URINALYSIS, ROUTINE W REFLEX MICROSCOPIC
Bilirubin Urine: NEGATIVE
GLUCOSE, UA: NEGATIVE mg/dL
HGB URINE DIPSTICK: NEGATIVE
KETONES UR: NEGATIVE mg/dL
Leukocytes, UA: NEGATIVE
Nitrite: NEGATIVE
Protein, ur: NEGATIVE mg/dL
Specific Gravity, Urine: 1.015 (ref 1.005–1.030)
UROBILINOGEN UA: 1 mg/dL (ref 0.0–1.0)
pH: 8 (ref 5.0–8.0)

## 2014-06-02 LAB — POCT PREGNANCY, URINE: Preg Test, Ur: NEGATIVE

## 2014-06-02 LAB — CBC
HCT: 37.6 % (ref 36.0–46.0)
Hemoglobin: 12.6 g/dL (ref 12.0–15.0)
MCH: 28.1 pg (ref 26.0–34.0)
MCHC: 33.5 g/dL (ref 30.0–36.0)
MCV: 83.7 fL (ref 78.0–100.0)
PLATELETS: 302 10*3/uL (ref 150–400)
RBC: 4.49 MIL/uL (ref 3.87–5.11)
RDW: 16.7 % — AB (ref 11.5–15.5)
WBC: 10.6 10*3/uL — AB (ref 4.0–10.5)

## 2014-06-02 MED ORDER — IBUPROFEN 600 MG PO TABS: 600.0000 mg | ORAL_TABLET | Freq: Four times a day (QID) | ORAL | Status: DC

## 2014-06-02 MED ORDER — CEFTRIAXONE SODIUM 250 MG IJ SOLR
250.0000 mg | Freq: Once | INTRAMUSCULAR | Status: AC
  Administered 2014-06-02: 250 mg via INTRAMUSCULAR
  Filled 2014-06-02: qty 250

## 2014-06-02 MED ORDER — TRAMADOL HCL 50 MG PO TABS: 50.0000 mg | ORAL_TABLET | Freq: Four times a day (QID) | ORAL | Status: DC | PRN

## 2014-06-02 MED ORDER — AZITHROMYCIN 250 MG PO TABS
1000.0000 mg | ORAL_TABLET | Freq: Once | ORAL | Status: AC
  Administered 2014-06-02: 1000 mg via ORAL
  Filled 2014-06-02: qty 4

## 2014-06-02 MED ORDER — AZITHROMYCIN 200 MG/5ML PO SUSR: 1000.0000 mg | Freq: Once | ORAL | Status: DC

## 2014-06-02 NOTE — MAU Note (Addendum)
Having mild to severe menstrual cramps, ongoing off and on for 2 wks.  consistent last 2-3 days.  Rt leg is swollen- past wk. Period is to come on next wk.   Had unprotected sex this past weekend, took a plan B yesterday.  Was already cramping

## 2014-06-02 NOTE — Progress Notes (Signed)
Marlou Porch CNM in to discuss test results and d/c plan. Written and verbal d/c instructions given and understanding voiced. Diet tray came and pt will take with her

## 2014-06-02 NOTE — Discharge Instructions (Signed)
Abdominal Pain, Women °Abdominal (stomach, pelvic, or belly) pain can be caused by many things. It is important to tell your doctor: °· The location of the pain. °· Does it come and go or is it present all the time? °· Are there things that start the pain (eating certain foods, exercise)? °· Are there other symptoms associated with the pain (fever, nausea, vomiting, diarrhea)? °All of this is helpful to know when trying to find the cause of the pain. °CAUSES  °· Stomach: virus or bacteria infection, or ulcer. °· Intestine: appendicitis (inflamed appendix), regional ileitis (Crohn's disease), ulcerative colitis (inflamed colon), irritable bowel syndrome, diverticulitis (inflamed diverticulum of the colon), or cancer of the stomach or intestine. °· Gallbladder disease or stones in the gallbladder. °· Kidney disease, kidney stones, or infection. °· Pancreas infection or cancer. °· Fibromyalgia (pain disorder). °· Diseases of the female organs: °¨ Uterus: fibroid (non-cancerous) tumors or infection. °¨ Fallopian tubes: infection or tubal pregnancy. °¨ Ovary: cysts or tumors. °¨ Pelvic adhesions (scar tissue). °¨ Endometriosis (uterus lining tissue growing in the pelvis and on the pelvic organs). °¨ Pelvic congestion syndrome (female organs filling up with blood just before the menstrual period). °¨ Pain with the menstrual period. °¨ Pain with ovulation (producing an egg). °¨ Pain with an IUD (intrauterine device, birth control) in the uterus. °¨ Cancer of the female organs. °· Functional pain (pain not caused by a disease, may improve without treatment). °· Psychological pain. °· Depression. °DIAGNOSIS  °Your doctor will decide the seriousness of your pain by doing an examination. °· Blood tests. °· X-rays. °· Ultrasound. °· CT scan (computed tomography, special type of X-ray). °· MRI (magnetic resonance imaging). °· Cultures, for infection. °· Barium enema (dye inserted in the large intestine, to better view it with  X-rays). °· Colonoscopy (looking in intestine with a lighted tube). °· Laparoscopy (minor surgery, looking in abdomen with a lighted tube). °· Major abdominal exploratory surgery (looking in abdomen with a large incision). °TREATMENT  °The treatment will depend on the cause of the pain.  °· Many cases can be observed and treated at home. °· Over-the-counter medicines recommended by your caregiver. °· Prescription medicine. °· Antibiotics, for infection. °· Birth control pills, for painful periods or for ovulation pain. °· Hormone treatment, for endometriosis. °· Nerve blocking injections. °· Physical therapy. °· Antidepressants. °· Counseling with a psychologist or psychiatrist. °· Minor or major surgery. °HOME CARE INSTRUCTIONS  °· Do not take laxatives, unless directed by your caregiver. °· Take over-the-counter pain medicine only if ordered by your caregiver. Do not take aspirin because it can cause an upset stomach or bleeding. °· Try a clear liquid diet (broth or water) as ordered by your caregiver. Slowly move to a bland diet, as tolerated, if the pain is related to the stomach or intestine. °· Have a thermometer and take your temperature several times a day, and record it. °· Bed rest and sleep, if it helps the pain. °· Avoid sexual intercourse, if it causes pain. °· Avoid stressful situations. °· Keep your follow-up appointments and tests, as your caregiver orders. °· If the pain does not go away with medicine or surgery, you may try: °¨ Acupuncture. °¨ Relaxation exercises (yoga, meditation). °¨ Group therapy. °¨ Counseling. °SEEK MEDICAL CARE IF:  °· You notice certain foods cause stomach pain. °· Your home care treatment is not helping your pain. °· You need stronger pain medicine. °· You want your IUD removed. °· You feel faint or   lightheaded.  You develop nausea and vomiting.  You develop a rash.  You are having side effects or an allergy to your medicine. SEEK IMMEDIATE MEDICAL CARE IF:   Your  pain does not go away or gets worse.  You have a fever.  Your pain is felt only in portions of the abdomen. The right side could possibly be appendicitis. The left lower portion of the abdomen could be colitis or diverticulitis.  You are passing blood in your stools (bright red or black tarry stools, with or without vomiting).  You have blood in your urine.  You develop chills, with or without a fever.  You pass out. MAKE SURE YOU:   Understand these instructions.  Will watch your condition.  Will get help right away if you are not doing well or get worse. Document Released: 05/21/2007 Document Revised: 12/08/2013 Document Reviewed: 06/10/2009 [facility] Patient Information 2015 [place], [place]. This information is not intended to replace advice given to you by your health care provider. Make sure you discuss any questions you have with your health care provider.  Pelvic Inflammatory Disease Pelvic inflammatory disease (PID) refers to an infection in some or all of the female organs. The infection can be in the uterus, ovaries, fallopian tubes, or the surrounding tissues in the pelvis. PID can cause abdominal or pelvic pain that comes on suddenly (acute pelvic pain). PID is a serious infection because it can lead to lasting (chronic) pelvic pain or the inability to have children (infertile).  CAUSES  The infection is often caused by the normal bacteria found in the vaginal tissues. PID may also be caused by an infection that is spread during sexual contact. PID can also occur following:   The birth of a baby.   A miscarriage.   An abortion.   Major pelvic surgery.   The use of an intrauterine device (IUD).   A sexual assault.  RISK FACTORS Certain factors can put a person at higher risk for PID, such as:  Being younger than 25 years.  Being sexually active at [place] age.  Usingnonbarrier contraception.  Havingmultiple sexual partners.  Having sex with someone  who has symptoms of a genital infection.  Using oral contraception. Other times, certain behaviors can increase the possibility of getting PID, such as:  Having sex during your period.  Using a vaginal douche.  Having an intrauterine device (IUD) in place. SYMPTOMS   Abdominal or pelvic pain.   Fever.   Chills.   Abnormal vaginal discharge.  Abnormal uterine bleeding.   Unusual pain shortly after finishing your period. DIAGNOSIS  Your caregiver will choose some of the following methods to make a diagnosis, such as:   Performinga physical exam and history. A pelvic exam typically reveals a very tender uterus and surrounding pelvis.   Ordering laboratory tests including a pregnancy test, blood tests, and urine test.  Orderingcultures of the vagina and cervix to check for a sexually transmitted infection (STI).  Performing an ultrasound.   Performing a laparoscopic procedure to look inside the pelvis.  TREATMENT   Antibiotic medicines may be prescribed and taken by mouth.   Sexual partners may be treated when the infection is caused by a sexually transmitted disease (STD).   Hospitalization may be needed to give antibiotics intravenously.  Surgery may be needed, but this is rare. It may take weeks until you are completely well. If you are diagnosed with PID, you should also be checked for human immunodeficiency virus (HIV). HOME  CARE INSTRUCTIONS   If given, take your antibiotics as directed. Finish the medicine even if you start to feel better.   Only take over-the-counter or prescription medicines for pain, discomfort, or fever as directed by your caregiver.   Do not have sexual intercourse until treatment is completed or as directed by your caregiver. If PID is confirmed, your recent sexual partner(s) will need treatment.   Keep your follow-up appointments. SEEK MEDICAL CARE IF:   You have increased or abnormal vaginal discharge.   You need  prescription medicine for your pain.   You vomit.   You cannot take your medicines.   Your partner has an STD.  SEEK IMMEDIATE MEDICAL CARE IF:   You have a fever.   You have increased abdominal or pelvic pain.   You have chills.   You have pain when you urinate.   You are not better after 72 hours following treatment.  MAKE SURE YOU:   Understand these instructions.  Will watch your condition.  Will get help right away if you are not doing well or get worse. Document Released: 07/24/2005 Document Revised: 11/18/2012 Document Reviewed: 07/20/2011 [facility] Patient Information 2015 [place], [place]. This information is not intended to replace advice given to you by your health care provider. Make sure you discuss any questions you have with your health care provider.  [facility]  [phone]     Phone: [place]                      [facility]  @[place]     Phone: [phone]  Phone: [place]     Phone: [phone]  Phone: [facility] [place]                hone: [phone]  Phone: [phone]         [facility] Dr. Gracy Racer      Phone: [phone]  Phone: [phone]         [place] [facility]                Phone: [phone]  [place])          Phone: [phone] [facility]   Phone: [phone]

## 2014-06-03 LAB — GC/CHLAMYDIA PROBE AMP
CT PROBE, AMP APTIMA: POSITIVE — AB
GC PROBE AMP APTIMA: NEGATIVE

## 2014-06-08 ENCOUNTER — Encounter (HOSPITAL_COMMUNITY): Payer: Self-pay | Admitting: *Deleted

## 2014-11-02 ENCOUNTER — Inpatient Hospital Stay (HOSPITAL_COMMUNITY)
Admission: AD | Admit: 2014-11-02 | Discharge: 2014-11-02 | Disposition: A | Discharge status: Home / Self Care | Payer: Medicaid Other | Source: Ambulatory Visit | Attending: Obstetrics & Gynecology | Admitting: Obstetrics & Gynecology

## 2014-11-02 DIAGNOSIS — J069 Acute upper respiratory infection, unspecified: Secondary | ICD-10-CM

## 2014-11-02 DIAGNOSIS — R52 Pain, unspecified: Secondary | ICD-10-CM | POA: Insufficient documentation

## 2014-11-02 DIAGNOSIS — J029 Acute pharyngitis, unspecified: Secondary | ICD-10-CM | POA: Insufficient documentation

## 2014-11-02 DIAGNOSIS — R197 Diarrhea, unspecified: Secondary | ICD-10-CM | POA: Diagnosis not present

## 2014-11-02 LAB — POCT PREGNANCY, URINE: PREG TEST UR: NEGATIVE

## 2014-11-02 LAB — RAPID STREP SCREEN (MED CTR MEBANE ONLY): STREPTOCOCCUS, GROUP A SCREEN (DIRECT): NEGATIVE

## 2014-11-02 NOTE — MAU Provider Note (Signed)
History     CSN: [id number]  Arrival date and time: 11/02/14 1814   First Provider Initiated Contact with Patient 11/02/14 1842      Chief Complaint  Patient presents with  . Diarrhea  . Sore Throat  . Generalized Body Aches   HPI Ms. Gloria Adams is a 24 y.o. G1P1001 who presents to MAU today with complaint of sore throat x 2 weeks. She also endorses some mild nasal congestion. She denies headache, ear pain, drainage. She states occasional productive cough with clear sputum. She denies fever or abdominal pain or sick contacts. She states some diarrhea since yesterday, but denies N/V.   OB History    Gravida Para Term Preterm AB TAB SAB Ectopic Multiple Living   1 1 1  0 0 0 0 0 0 1      Past Medical History  Diagnosis Date  . Medical history non-contributory   . Pregnancy induced hypertension     end of preg    Past Surgical History  Procedure Laterality Date  . No past surgeries      Family History  Problem Relation Age of Onset  . Hypertension Father   . Diabetes Maternal Grandmother   . Diabetes Paternal Grandmother     History  Substance Use Topics  . Smoking status: Never Smoker   . Smokeless tobacco: Never Used  . Alcohol Use: Yes     Comment: every other weekend    Allergies: No Known Allergies  No prescriptions prior to admission    Review of Systems  Constitutional: Negative for fever and malaise/fatigue.  HENT: Positive for congestion and sore throat. Negative for ear discharge and ear pain.   Respiratory: Positive for sputum production. Negative for cough and shortness of breath.   Cardiovascular: Negative for chest pain.  Gastrointestinal: Negative for abdominal pain.  Neurological: Negative for weakness and headaches.   Physical Exam   Blood pressure 119/73, pulse 114, temperature 99 F (37.2 C), temperature source Axillary, resp. rate 16, height 5\' 1"  (1.549 m), weight 161 lb (73.029 kg), last menstrual period 11/02/2014, SpO2 99 %,  not currently breastfeeding.  Physical Exam  Constitutional: She is oriented to person, place, and time. She appears well-developed and well-nourished. She appears distressed.  HENT:  Head: Normocephalic.  Right Ear: Tympanic membrane, external ear and ear canal normal.  Left Ear: Tympanic membrane, external ear and ear canal normal.  Nose: Rhinorrhea present. No mucosal edema.  No foreign bodies. Right sinus exhibits no maxillary sinus tenderness and no frontal sinus tenderness. Left sinus exhibits no maxillary sinus tenderness and no frontal sinus tenderness.  Mouth/Throat: Mucous membranes are normal. Posterior oropharyngeal erythema present. No oropharyngeal exudate, posterior oropharyngeal edema or tonsillar abscesses.  Cardiovascular: Normal rate, regular rhythm and normal heart sounds.   Respiratory: Effort normal and breath sounds normal. No respiratory distress. She has no wheezes.  Lymphadenopathy:       Head (right side): No submental, no submandibular and no tonsillar adenopathy present.       Head (left side): No submental, no submandibular and no tonsillar adenopathy present.    She has no cervical adenopathy.  Neurological: She is alert and oriented to person, place, and time.  Skin: Skin is warm and dry. No erythema.  Psychiatric: She has a normal mood and affect.   Results for orders placed or performed during the hospital encounter of 11/02/14 (from the past 24 hour(s))  Pregnancy, urine POC     Status: None  Collection Time: 11/02/14  6:44 PM  Result Value Ref Range   Preg Test, Ur NEGATIVE NEGATIVE     MAU Course  Procedures None  MDM UPT - negative Strep culture pending. Will contact with positive results only  Assessment and Plan  A: Viral URI vs Strep throat  P: Discharge home List of OTC medications needed given Recommended increased PO hydration Patient advised to follow-up with PCP or urgent care if symptoms persist or worsen Patient may return  to MAU as needed or if her condition were to change or worsen   Luvenia Redden, PA-C  11/02/2014, 7:07 PM

## 2014-11-02 NOTE — MAU Note (Signed)
Sore throat for 2 wks. Started yesterday with body, headache and the runs, nausea, fatigue, no fever

## 2014-11-02 NOTE — Discharge Instructions (Signed)
Upper Respiratory Infection, Adult An upper respiratory infection (URI) is also sometimes known as the common cold. The upper respiratory tract includes the nose, sinuses, throat, trachea, and bronchi. Bronchi are the airways leading to the lungs. Most people improve within 1 week, but symptoms can last up to 2 weeks. A residual cough may last even longer.  CAUSES Many different viruses can infect the tissues lining the upper respiratory tract. The tissues become irritated and inflamed and often become very moist. Mucus production is also common. A cold is contagious. You can easily spread the virus to others by oral contact. This includes kissing, sharing a glass, coughing, or sneezing. Touching your mouth or nose and then touching a surface, which is then touched by another person, can also spread the virus. SYMPTOMS  Symptoms typically develop 1 to 3 days after you come in contact with a cold virus. Symptoms vary from person to person. They may include:  Runny nose.  Sneezing.  Nasal congestion.  Sinus irritation.  Sore throat.  Loss of voice (laryngitis).  Cough.  Fatigue.  Muscle aches.  Loss of appetite.  Headache.  Low-grade fever. DIAGNOSIS  You might diagnose your own cold based on familiar symptoms, since most people get a cold 2 to 3 times a year. Your caregiver can confirm this based on your exam. Most importantly, your caregiver can check that your symptoms are not due to another disease such as strep throat, sinusitis, pneumonia, asthma, or epiglottitis. Blood tests, throat tests, and X-rays are not necessary to diagnose a common cold, but they may sometimes be helpful in excluding other more serious diseases. Your caregiver will decide if any further tests are required. RISKS AND COMPLICATIONS  You may be at risk for a more severe case of the common cold if you smoke cigarettes, have chronic heart disease (such as heart failure) or lung disease (such as asthma), or if  you have a weakened immune system. The very young and very old are also at risk for more serious infections. Bacterial sinusitis, middle ear infections, and bacterial pneumonia can complicate the common cold. The common cold can worsen asthma and chronic obstructive pulmonary disease (COPD). Sometimes, these complications can require emergency medical care and may be life-threatening. PREVENTION  The best way to protect against getting a cold is to practice good hygiene. Avoid oral or hand contact with people with cold symptoms. Wash your hands often if contact occurs. There is no clear evidence that vitamin C, vitamin E, echinacea, or exercise reduces the chance of developing a cold. However, it is always recommended to get plenty of rest and practice good nutrition. TREATMENT  Treatment is directed at relieving symptoms. There is no cure. Antibiotics are not effective, because the infection is caused by a virus, not by bacteria. Treatment may include:  Increased fluid intake. Sports drinks offer valuable electrolytes, sugars, and fluids.  Breathing heated mist or steam (vaporizer or shower).  Eating chicken soup or other clear broths, and maintaining good nutrition.  Getting plenty of rest.  Using gargles or lozenges for comfort.  Controlling fevers with ibuprofen or acetaminophen as directed by your caregiver.  Increasing usage of your inhaler if you have asthma. Zinc gel and zinc lozenges, taken in the first 24 hours of the common cold, can shorten the duration and lessen the severity of symptoms. Pain medicines may help with fever, muscle aches, and throat pain. A variety of non-prescription medicines are available to treat congestion and runny nose. Your caregiver   can make recommendations and may suggest nasal or lung inhalers for other symptoms.  HOME CARE INSTRUCTIONS   Only take over-the-counter or prescription medicines for pain, discomfort, or fever as directed by your  caregiver.  Use a warm mist humidifier or inhale steam from a shower to increase air moisture. This may keep secretions moist and make it easier to breathe.  Drink enough water and fluids to keep your urine clear or pale yellow.  Rest as needed.  Return to work when your temperature has returned to normal or as your caregiver advises. You may need to stay home longer to avoid infecting others. You can also use a face mask and careful hand washing to prevent spread of the virus. SEEK MEDICAL CARE IF:   After the first few days, you feel you are getting worse rather than better.  You need your caregiver's advice about medicines to control symptoms.  You develop chills, worsening shortness of breath, or brown or red sputum. These may be signs of pneumonia.  You develop yellow or brown nasal discharge or pain in the face, especially when you bend forward. These may be signs of sinusitis.  You develop a fever, swollen neck glands, pain with swallowing, or white areas in the back of your throat. These may be signs of strep throat. SEEK IMMEDIATE MEDICAL CARE IF:   You have a fever.  You develop severe or persistent headache, ear pain, sinus pain, or chest pain.  You develop wheezing, a prolonged cough, cough up blood, or have a change in your usual mucus (if you have chronic lung disease).  You develop sore muscles or a stiff neck. Document Released: 01/17/2001 Document Revised: 10/16/2011 Document Reviewed: 10/29/2013 [employer] Patient Information 2015 [employer]. This information is not intended to replace advice given to you by your health care provider. Make sure you discuss any questions you have with your health care provider.  

## 2014-11-05 LAB — CULTURE, GROUP A STREP

## 2014-11-13 ENCOUNTER — Inpatient Hospital Stay (HOSPITAL_COMMUNITY)
Admission: AD | Admit: 2014-11-13 | Discharge: 2014-11-13 | Disposition: A | Discharge status: Home / Self Care | Payer: Medicaid Other | Source: Ambulatory Visit | Attending: Family Medicine | Admitting: Family Medicine

## 2014-11-13 ENCOUNTER — Encounter (HOSPITAL_COMMUNITY): Payer: Self-pay | Admitting: *Deleted

## 2014-11-13 DIAGNOSIS — N750 Cyst of Bartholin's gland: Secondary | ICD-10-CM | POA: Insufficient documentation

## 2014-11-13 MED ORDER — OXYCODONE-ACETAMINOPHEN 5-325 MG PO TABS: 1.0000 | ORAL_TABLET | Freq: Four times a day (QID) | ORAL | Status: DC | PRN

## 2014-11-13 MED ORDER — SULFAMETHOXAZOLE-TRIMETHOPRIM 800-160 MG PO TABS: 1.0000 | ORAL_TABLET | Freq: Two times a day (BID) | ORAL | Status: DC

## 2014-11-13 NOTE — Discharge Instructions (Signed)
Bartholin's Cyst or Abscess °Bartholin's glands are small glands located within the folds of skin (labia) along the sides of the lower opening of the vagina (birth canal). A cyst may develop when the duct of the gland becomes blocked. When this happens, fluid that accumulates within the cyst can become infected. This is known as an abscess. The Bartholin gland produces a mucous fluid to lubricate the outside of the vagina during sexual intercourse. °SYMPTOMS  °· Patients with a small cyst may not have any symptoms. °· Mild discomfort to severe pain depending on the size of the cyst and if it is infected (abscess). °· Pain, redness, and swelling around the lower opening of the vagina. °· Painful intercourse. °· Pressure in the perineal area. °· Swelling of the lips of the vagina (labia). °· The cyst or abscess can be on one side or both sides of the vagina. °DIAGNOSIS  °· A large swelling is seen in the lower vagina area by your caregiver. °· Painful to touch. °· Redness and pain, if it is an abscess. °TREATMENT  °· Sometimes the cyst will go away on its own. °· Apply warm wet compresses to the area or take hot sitz baths several times a day. °· An incision to drain the cyst or abscess with local anesthesia. °· Culture the pus, if it is an abscess. °· Antibiotic treatment, if it is an abscess. °· Cut open the gland and suture the edges to make the opening of the gland bigger (marsupialization). °· Remove the whole gland if the cyst or abscess returns. °PREVENTION  °· Practice good hygiene. °· Clean the vaginal area with a mild soap and soft cloth when bathing. °· Do not rub hard in the vaginal area when bathing. °· Protect the crotch area with a padded cushion if you take long bike rides or ride horses. °· Be sure you are well lubricated when you have sexual intercourse. °HOME CARE INSTRUCTIONS  °· If your cyst or abscess was opened, a small piece of gauze, or a drain, may have been placed in the wound to allow  drainage. Do not remove this gauze or drain unless directed by your caregiver. °· Wear feminine pads, not tampons, as needed for any drainage or bleeding. °· If antibiotics were prescribed, take them exactly as directed. Finish the entire course. °· Only take over-the-counter or prescription medicines for pain, discomfort, or fever as directed by your caregiver. °SEEK IMMEDIATE MEDICAL CARE IF:  °· You have an increase in pain, redness, swelling, or drainage. °· You have bleeding from the wound which results in the use of more than the number of pads suggested by your caregiver in 24 hours. °· You have chills. °· You have a fever. °· You develop any new problems (symptoms) or aggravation of your existing condition. °MAKE SURE YOU:  °· Understand these instructions. °· Will watch your condition. °· Will get help right away if you are not doing well or get worse. °Document Released: 07/24/2005 Document Revised: 10/16/2011 Document Reviewed: 03/11/2008 °[employer]® Patient Information ©2015 [employer]. This information is not intended to replace advice given to you by your health care provider. Make sure you discuss any questions you have with your health care provider. ° °

## 2014-11-13 NOTE — MAU Note (Signed)
Bartholin's cyst- this is like the 4th time.  Last night it became real sore and irritated, hard to sit down

## 2014-11-13 NOTE — MAU Provider Note (Signed)
  History     CSN: [id number]  Arrival date and time: 11/13/14 1831   First Provider Initiated Contact with Patient 11/13/14 1913      Chief Complaint  Patient presents with  . Bartholin's Cyst   HPI  Ms. Gloria Adams is a 24 y.o. G1P1001 who presents to MAU today with complaint of a bartholin's gland cyst. She states at least 4 previous bartholin's gland abscesses within the last 1.5-2 years. She states some edema in the area of previous I&D consistently x 1 year. The area began to swell and became painful last night. She denies drainage, bleeding, abnormal vaginal discharge or fever.   OB History    Gravida Para Term Preterm AB TAB SAB Ectopic Multiple Living   1 1 1  0 0 0 0 0 0 1      Past Medical History  Diagnosis Date  . Medical history non-contributory   . Pregnancy induced hypertension     end of preg    Past Surgical History  Procedure Laterality Date  . No past surgeries      Family History  Problem Relation Age of Onset  . Hypertension Father   . Diabetes Maternal Grandmother   . Diabetes Paternal Grandmother     History  Substance Use Topics  . Smoking status: Never Smoker   . Smokeless tobacco: Never Used  . Alcohol Use: Yes     Comment: every other weekend    Allergies: No Known Allergies  Prescriptions prior to admission  Medication Sig Dispense Refill Last Dose  . ibuprofen (ADVIL,MOTRIN) 600 MG tablet Take 1 tablet (600 mg total) by mouth every 6 (six) hours. 30 tablet 1   . traMADol (ULTRAM) 50 MG tablet Take 1-2 tablets (50-100 mg total) by mouth every 6 (six) hours as needed for severe pain (If no relief w/ scheduled Ibuprofen). 30 tablet 0     Review of Systems  Constitutional: Negative for fever and malaise/fatigue.  Genitourinary:       Neg - vaginal bleeding, discharge   Physical Exam   Blood pressure 114/69, pulse 92, temperature 98.1 F (36.7 C), temperature source Oral, resp. rate 16, weight 159 lb (72.122 kg), last menstrual  period 11/02/2014, not currently breastfeeding.  Physical Exam  Constitutional: She is oriented to person, place, and time. She appears well-developed and well-nourished. No distress.  HENT:  Head: Normocephalic.  Cardiovascular: Normal rate.   Respiratory: Effort normal.  Genitourinary:    There is tenderness on the right labia. There is no rash, lesion or injury on the right labia. There is no rash, tenderness, lesion or injury on the left labia.  Neurological: She is alert and oriented to person, place, and time.  Skin: Skin is warm and dry. No erythema.  Psychiatric: She has a normal mood and affect.    MAU Course  Procedures None  MDM Discussed use of warm compresses and sitz baths to promote drainage.    Assessment and Plan  A: Bartholin's gland cyst  P: Discharge home Rx for Percocet and Bactrim given to patient Patient advised to use warm compresses and sitz baths 2-4 times/day Warning signs for infection and/or worsening condition discussed Patient referred to [facility] for consult for marsupialization  Patient may return to MAU as needed or if her condition were to change or worsen   Luvenia Redden, PA-C  11/13/2014, 7:27 PM

## 2014-11-14 ENCOUNTER — Inpatient Hospital Stay (HOSPITAL_COMMUNITY)
Admission: AD | Admit: 2014-11-14 | Discharge: 2014-11-14 | Disposition: A | Discharge status: Home / Self Care | Payer: Medicaid Other | Source: Ambulatory Visit | Attending: Obstetrics & Gynecology | Admitting: Obstetrics & Gynecology

## 2014-11-14 ENCOUNTER — Encounter (HOSPITAL_COMMUNITY): Payer: Self-pay | Admitting: *Deleted

## 2014-11-14 DIAGNOSIS — N898 Other specified noninflammatory disorders of vagina: Secondary | ICD-10-CM | POA: Diagnosis present

## 2014-11-14 DIAGNOSIS — N750 Cyst of Bartholin's gland: Secondary | ICD-10-CM | POA: Diagnosis not present

## 2014-11-14 HISTORY — DX: Cyst of Bartholin's gland: N75.0

## 2014-11-14 LAB — URINALYSIS, ROUTINE W REFLEX MICROSCOPIC
Bilirubin Urine: NEGATIVE
GLUCOSE, UA: NEGATIVE mg/dL
Ketones, ur: NEGATIVE mg/dL
Leukocytes, UA: NEGATIVE
Nitrite: NEGATIVE
Protein, ur: NEGATIVE mg/dL
SPECIFIC GRAVITY, URINE: 1.025 (ref 1.005–1.030)
Urobilinogen, UA: 0.2 mg/dL (ref 0.0–1.0)
pH: 6.5 (ref 5.0–8.0)

## 2014-11-14 LAB — URINE MICROSCOPIC-ADD ON

## 2014-11-14 LAB — POCT PREGNANCY, URINE: PREG TEST UR: NEGATIVE

## 2014-11-14 MED ORDER — OXYCODONE-ACETAMINOPHEN 5-325 MG PO TABS
1.0000 | ORAL_TABLET | Freq: Once | ORAL | Status: AC
  Administered 2014-11-14: 1 via ORAL
  Filled 2014-11-14: qty 1

## 2014-11-14 NOTE — MAU Note (Signed)
C/o bartholin cyst; states that this is the 4th cyst that she has had in 2 years; seen in MAU yesterday for same problem; pain has increased since yesterday;

## 2014-11-14 NOTE — Discharge Instructions (Signed)
Bartholin's Cyst or Abscess °Bartholin's glands are small glands located within the folds of skin (labia) along the sides of the lower opening of the vagina (birth canal). A cyst may develop when the duct of the gland becomes blocked. When this happens, fluid that accumulates within the cyst can become infected. This is known as an abscess. The Bartholin gland produces a mucous fluid to lubricate the outside of the vagina during sexual intercourse. °SYMPTOMS  °· Patients with a small cyst may not have any symptoms. °· Mild discomfort to severe pain depending on the size of the cyst and if it is infected (abscess). °· Pain, redness, and swelling around the lower opening of the vagina. °· Painful intercourse. °· Pressure in the perineal area. °· Swelling of the lips of the vagina (labia). °· The cyst or abscess can be on one side or both sides of the vagina. °DIAGNOSIS  °· A large swelling is seen in the lower vagina area by your caregiver. °· Painful to touch. °· Redness and pain, if it is an abscess. °TREATMENT  °· Sometimes the cyst will go away on its own. °· Apply warm wet compresses to the area or take hot sitz baths several times a day. °· An incision to drain the cyst or abscess with local anesthesia. °· Culture the pus, if it is an abscess. °· Antibiotic treatment, if it is an abscess. °· Cut open the gland and suture the edges to make the opening of the gland bigger (marsupialization). °· Remove the whole gland if the cyst or abscess returns. °PREVENTION  °· Practice good hygiene. °· Clean the vaginal area with a mild soap and soft cloth when bathing. °· Do not rub hard in the vaginal area when bathing. °· Protect the crotch area with a padded cushion if you take long bike rides or ride horses. °· Be sure you are well lubricated when you have sexual intercourse. °HOME CARE INSTRUCTIONS  °· If your cyst or abscess was opened, a small piece of gauze, or a drain, may have been placed in the wound to allow  drainage. Do not remove this gauze or drain unless directed by your caregiver. °· Wear feminine pads, not tampons, as needed for any drainage or bleeding. °· If antibiotics were prescribed, take them exactly as directed. Finish the entire course. °· Only take over-the-counter or prescription medicines for pain, discomfort, or fever as directed by your caregiver. °SEEK IMMEDIATE MEDICAL CARE IF:  °· You have an increase in pain, redness, swelling, or drainage. °· You have bleeding from the wound which results in the use of more than the number of pads suggested by your caregiver in 24 hours. °· You have chills. °· You have a fever. °· You develop any new problems (symptoms) or aggravation of your existing condition. °MAKE SURE YOU:  °· Understand these instructions. °· Will watch your condition. °· Will get help right away if you are not doing well or get worse. °Document Released: 07/24/2005 Document Revised: 10/16/2011 Document Reviewed: 03/11/2008 °[employer]® Patient Information ©2015 [employer]. This information is not intended to replace advice given to you by your health care provider. Make sure you discuss any questions you have with your health care provider. ° °

## 2014-11-14 NOTE — MAU Provider Note (Signed)
History     CSN: [id number]  Arrival date and time: 11/14/14 1424   First Provider Initiated Contact with Patient 11/14/14 1539      Chief Complaint  Patient presents with  . Vaginal Discharge   HPI   Ms. Gloria Adams is a 24 y.o. female G1P1001 who presents with a bartholin's cyst. She was seen yesterday in MAU with the same complaint and was sent home with antibiotics and told to do warm sitz baths and to come home when the cysts increased in size. She feels that the cyst has increased in size and is now more uncomfortable. She has been doing exactly what she was told to do and now feels like it needs to be drained. This is not a new problem, the patient has been seen here multiple times with other bartholin's cyst. She was told that she needed a marzupilization. She is having a lot of pain in the area where the cyst is, the pain is worse when she is sitting.   OB History    Gravida Para Term Preterm AB TAB SAB Ectopic Multiple Living   1 1 1  0 0 0 0 0 0 1      Past Medical History  Diagnosis Date  . Medical history non-contributory   . Pregnancy induced hypertension     end of preg  . Bartholin cyst     Past Surgical History  Procedure Laterality Date  . No past surgeries      Family History  Problem Relation Age of Onset  . Hypertension Father   . Diabetes Maternal Grandmother   . Diabetes Paternal Grandmother     History  Substance Use Topics  . Smoking status: Never Smoker   . Smokeless tobacco: Never Used  . Alcohol Use: Yes     Comment: every other weekend    Allergies: No Known Allergies  Prescriptions prior to admission  Medication Sig Dispense Refill Last Dose  . ibuprofen (ADVIL,MOTRIN) 200 MG tablet Take 600 mg by mouth every 6 (six) hours as needed for fever or headache.   11/14/2014 at Unknown time  . PRESCRIPTION MEDICATION Take 1 tablet by mouth daily. Birth control pills [facility].   11/13/2014 at Unknown time  . ibuprofen  (ADVIL,MOTRIN) 600 MG tablet Take 1 tablet (600 mg total) by mouth every 6 (six) hours. (Patient not taking: Reported on 11/13/2014) 30 tablet 1   . oxyCODONE-acetaminophen (PERCOCET/ROXICET) 5-325 MG per tablet Take 1 tablet by mouth every 6 (six) hours as needed for severe pain. (Patient not taking: Reported on 11/14/2014) 12 tablet 0   . sulfamethoxazole-trimethoprim (BACTRIM DS,SEPTRA DS) 800-160 MG per tablet Take 1 tablet by mouth 2 (two) times daily. (Patient not taking: Reported on 11/14/2014) 14 tablet 0   . traMADol (ULTRAM) 50 MG tablet Take 1-2 tablets (50-100 mg total) by mouth every 6 (six) hours as needed for severe pain (If no relief w/ scheduled Ibuprofen). (Patient not taking: Reported on 11/13/2014) 30 tablet 0 more than one month   Results for orders placed or performed during the hospital encounter of 11/14/14 (from the past 48 hour(s))  Urinalysis, Routine w reflex microscopic     Status: Abnormal   Collection Time: 11/14/14  2:44 PM  Result Value Ref Range   Color, Urine YELLOW YELLOW   APPearance CLEAR CLEAR   Specific Gravity, Urine 1.025 1.005 - 1.030   pH 6.5 5.0 - 8.0   Glucose, UA NEGATIVE NEGATIVE mg/dL  Hgb urine dipstick TRACE (A) NEGATIVE   Bilirubin Urine NEGATIVE NEGATIVE   Ketones, ur NEGATIVE NEGATIVE mg/dL   Protein, ur NEGATIVE NEGATIVE mg/dL   Urobilinogen, UA 0.2 0.0 - 1.0 mg/dL   Nitrite NEGATIVE NEGATIVE   Leukocytes, UA NEGATIVE NEGATIVE  Urine microscopic-add on     Status: Abnormal   Collection Time: 11/14/14  2:44 PM  Result Value Ref Range   Squamous Epithelial / LPF FEW (A) RARE   WBC, UA 0-2 <3 WBC/hpf   RBC / HPF 0-2 <3 RBC/hpf   Urine-Other MUCOUS PRESENT   Pregnancy, urine POC     Status: None   Collection Time: 11/14/14  2:50 PM  Result Value Ref Range   Preg Test, Ur NEGATIVE NEGATIVE    Comment:        THE SENSITIVITY OF THIS METHODOLOGY IS >24 mIU/mL     Review of Systems  Constitutional: Negative for fever and chills.    Genitourinary: Negative for dysuria and urgency.   Physical Exam   Blood pressure 116/67, pulse 111, temperature 98.3 F (36.8 C), temperature source Oral, resp. rate 18, last menstrual period 11/02/2014, not currently breastfeeding.  Physical Exam  Constitutional: She is oriented to person, place, and time. She appears well-developed and well-nourished. No distress.  HENT:  Head: Normocephalic.  Eyes: Pupils are equal, round, and reactive to light.  Neck: Neck supple.  Respiratory: Effort normal.  Genitourinary:    There is tenderness and lesion on the right labia.  Musculoskeletal: Normal range of motion.  Neurological: She is alert and oriented to person, place, and time.  Skin: Skin is warm. She is not diaphoretic.  Psychiatric: Her behavior is normal.    MAU Course  Procedures   Consent form signed. Time out.   Patient positioned and draped with sterile towels.  Preoperative medication: Percocet 1 tablet po    Area cleaned with betadine Local infiltrate with lidocaine 1%. Amount 2 ccs  I&D Scalpel size: #11blade Incision type: Straight single Complexity:Complex Drained moderate amount of purulent drainage Probed with curved hemostat to break up loculations Irrigated with NSS  Wound treatment: Word Catheter placed and inflated with 3 cc. Of sterile water.  Packing: none Patient tolerance: Tolerated procedure well.   MDM  Assessment and Plan   A:  1. Cyst of Bartholin's gland     P:  Discharge home in stable condition Follow up in the clinic in 6 week for follow up. Clinic will call you Leave ward cath in until follow up Return to MAU if symptoms worsen Ok to take ibuprofen as needed, as directed on the bottle Return with any fever.  Lezlie Lye, NP 11/14/2014 8:11 PM

## 2014-11-25 ENCOUNTER — Telehealth: Payer: Self-pay | Admitting: *Deleted

## 2014-11-25 NOTE — Telephone Encounter (Signed)
Patient called and stated that she had a word cath placed in mau on 4/9 and it has fallen out. Wanted to know if she needs to be sooner than her 6 week followup. I spoke with Dr. Roselie Awkward about this and he stated that she does not need to be seen prior to 6 week checkup. Called patient back and left message that we are returning your call.

## 2014-11-26 NOTE — Telephone Encounter (Signed)
Called pt and informed pt that she does not need to come in earlier.  She can come to her appt on 12/25/14 as scheduled and let her know if she has any signs of infection to please go to MAU.  Pt stated understanding.

## 2014-11-30 ENCOUNTER — Ambulatory Visit: Payer: Medicaid Other | Admitting: Family Medicine

## 2014-12-25 ENCOUNTER — Encounter: Payer: Self-pay | Admitting: Obstetrics & Gynecology

## 2014-12-25 ENCOUNTER — Ambulatory Visit (INDEPENDENT_AMBULATORY_CARE_PROVIDER_SITE_OTHER): Payer: Medicaid Other | Admitting: Obstetrics & Gynecology

## 2014-12-25 VITALS — BP 121/63 | HR 89 | Ht 62.0 in | Wt 162.9 lb

## 2014-12-25 DIAGNOSIS — N898 Other specified noninflammatory disorders of vagina: Secondary | ICD-10-CM | POA: Diagnosis not present

## 2014-12-25 DIAGNOSIS — Z3009 Encounter for other general counseling and advice on contraception: Secondary | ICD-10-CM

## 2014-12-25 DIAGNOSIS — N750 Cyst of Bartholin's gland: Secondary | ICD-10-CM | POA: Diagnosis not present

## 2014-12-25 DIAGNOSIS — Z309 Encounter for contraceptive management, unspecified: Secondary | ICD-10-CM | POA: Diagnosis not present

## 2014-12-25 MED ORDER — SULFAMETHOXAZOLE-TRIMETHOPRIM 800-160 MG PO TABS: 1.0000 | ORAL_TABLET | Freq: Two times a day (BID) | ORAL | Status: DC

## 2014-12-25 MED ORDER — ETONOGESTREL-ETHINYL ESTRADIOL 0.12-0.015 MG/24HR VA RING: VAGINAL_RING | VAGINAL | Status: DC

## 2014-12-25 NOTE — Patient Instructions (Signed)

## 2014-12-25 NOTE — Progress Notes (Signed)
Here for follow up of bartholin cyst and birth control. Was on birth control pill- took last active pill on 12/18/14. States has had unprotected intercourse last night.

## 2014-12-25 NOTE — Progress Notes (Signed)
   Subjective:    Patient ID: Gloria Adams, female    DOB: 07/16/1991, 24 y.o.   MRN: [id number]  HPI Pt presents for follow-up of bartholin's abscess and discussion about birth control. Patient has had 3 bartholin's cysts requiring drainage, the most recent in April. She was seen in the MAU and a ward catheter was placed which fell out about 1 week later. Her pain and swelling have completely resolved and she feels well. She is still having some foul smelling vaginal discharge without itching or pain.   She has been on OCPs for the past 10 months and is interested in switching to another method. She reports that she forgets to take her pill about twice a month. She has done research on the internet and is most interested in nuvaring. She feels confident that she can remember to use this correctly. She is not interested in LARC or depo. She had unprotected sex last night and is planning on taking plan B today.   Review of Systems  Constitutional: Negative for fever and chills.  Genitourinary: Positive for vaginal discharge. Negative for dysuria, vaginal bleeding, genital sores and vaginal pain.  All other systems reviewed and are negative.      Objective:   Physical Exam  Constitutional: She is oriented to person, place, and time. She appears well-developed and well-nourished. No distress.  HENT:  Head: Normocephalic and atraumatic.  Eyes: Conjunctivae are normal. Right eye exhibits no discharge. Left eye exhibits no discharge.  Cardiovascular: Normal rate.   Pulmonary/Chest: Effort normal. No respiratory distress.  Abdominal: Soft. She exhibits no distension. There is no tenderness.  Genitourinary: Vagina normal and uterus normal.  Slight white creamy vaginal discharge. Right labia well healed from bartholin's cyst s/p I&D. No sign of recurrent cyst or abscess  Musculoskeletal: She exhibits no edema.  Neurological: She is alert and oriented to person, place, and time.  Skin: Skin is  warm and dry. No rash noted. She is not diaphoretic.  Psychiatric: She has a normal mood and affect. Her behavior is normal.  Nursing note and vitals reviewed.         Assessment & Plan:  Bartholin's cyst: recent abscess well healed. 3 abscesses over the past 2 years. Will rx bactrim to take at first sign of recurrence.  Contraception: Patient to take plan B tonight for recent unprotected intercourse. Will rx nuvaring to start tomorrow  I saw patient with the resident and I agree with documentation and plan  Woodroe Mode, MD 12/25/2014

## 2014-12-26 LAB — WET PREP, GENITAL
CLUE CELLS WET PREP: NONE SEEN
TRICH WET PREP: NONE SEEN
YEAST WET PREP: NONE SEEN

## 2014-12-28 ENCOUNTER — Ambulatory Visit: Payer: Medicaid Other | Admitting: Obstetrics & Gynecology

## 2015-02-26 ENCOUNTER — Emergency Department (HOSPITAL_COMMUNITY): Payer: Medicaid Other

## 2015-02-26 ENCOUNTER — Emergency Department (HOSPITAL_COMMUNITY)
Admission: EM | Admit: 2015-02-26 | Discharge: 2015-02-26 | Disposition: A | Discharge status: Home / Self Care | Payer: Medicaid Other | Attending: Emergency Medicine | Admitting: Emergency Medicine

## 2015-02-26 ENCOUNTER — Encounter: Payer: Self-pay | Admitting: Neurology

## 2015-02-26 ENCOUNTER — Ambulatory Visit (INDEPENDENT_AMBULATORY_CARE_PROVIDER_SITE_OTHER): Payer: Medicaid Other | Admitting: Neurology

## 2015-02-26 ENCOUNTER — Encounter (HOSPITAL_COMMUNITY): Payer: Self-pay | Admitting: Emergency Medicine

## 2015-02-26 VITALS — BP 118/64 | HR 68 | Resp 18 | Ht 62.0 in | Wt 161.6 lb

## 2015-02-26 DIAGNOSIS — G9389 Other specified disorders of brain: Secondary | ICD-10-CM

## 2015-02-26 DIAGNOSIS — H547 Unspecified visual loss: Secondary | ICD-10-CM | POA: Diagnosis present

## 2015-02-26 DIAGNOSIS — Z8742 Personal history of other diseases of the female genital tract: Secondary | ICD-10-CM | POA: Insufficient documentation

## 2015-02-26 DIAGNOSIS — Z792 Long term (current) use of antibiotics: Secondary | ICD-10-CM | POA: Insufficient documentation

## 2015-02-26 DIAGNOSIS — H5347 Heteronymous bilateral field defects: Secondary | ICD-10-CM | POA: Diagnosis not present

## 2015-02-26 DIAGNOSIS — Z8669 Personal history of other diseases of the nervous system and sense organs: Secondary | ICD-10-CM | POA: Insufficient documentation

## 2015-02-26 DIAGNOSIS — D332 Benign neoplasm of brain, unspecified: Secondary | ICD-10-CM | POA: Diagnosis not present

## 2015-02-26 DIAGNOSIS — R220 Localized swelling, mass and lump, head: Secondary | ICD-10-CM

## 2015-02-26 LAB — CBC WITH DIFFERENTIAL/PLATELET
Basophils Absolute: 0 10*3/uL (ref 0.0–0.1)
Basophils Relative: 0 % (ref 0–1)
EOS PCT: 1 % (ref 0–5)
Eosinophils Absolute: 0.1 10*3/uL (ref 0.0–0.7)
HEMATOCRIT: 39.6 % (ref 36.0–46.0)
Hemoglobin: 13.2 g/dL (ref 12.0–15.0)
LYMPHS ABS: 2.2 10*3/uL (ref 0.7–4.0)
Lymphocytes Relative: 25 % (ref 12–46)
MCH: 27.7 pg (ref 26.0–34.0)
MCHC: 33.3 g/dL (ref 30.0–36.0)
MCV: 83 fL (ref 78.0–100.0)
MONOS PCT: 5 % (ref 3–12)
Monocytes Absolute: 0.4 10*3/uL (ref 0.1–1.0)
Neutro Abs: 6.1 10*3/uL (ref 1.7–7.7)
Neutrophils Relative %: 69 % (ref 43–77)
Platelets: 380 10*3/uL (ref 150–400)
RBC: 4.77 MIL/uL (ref 3.87–5.11)
RDW: 15.2 % (ref 11.5–15.5)
WBC: 8.9 10*3/uL (ref 4.0–10.5)

## 2015-02-26 LAB — COMPREHENSIVE METABOLIC PANEL
ALBUMIN: 4.3 g/dL (ref 3.5–5.0)
ALK PHOS: 68 U/L (ref 38–126)
ALT: 16 U/L (ref 14–54)
ANION GAP: 9 (ref 5–15)
AST: 18 U/L (ref 15–41)
BILIRUBIN TOTAL: 0.7 mg/dL (ref 0.3–1.2)
BUN: 9 mg/dL (ref 6–20)
CHLORIDE: 103 mmol/L (ref 101–111)
CO2: 24 mmol/L (ref 22–32)
Calcium: 9.5 mg/dL (ref 8.9–10.3)
Creatinine, Ser: 0.66 mg/dL (ref 0.44–1.00)
GFR calc Af Amer: >60 mL/min (ref >60)
GLUCOSE: 92 mg/dL (ref 65–99)
Potassium: 4 mmol/L (ref 3.5–5.1)
Sodium: 136 mmol/L (ref 135–145)
TOTAL PROTEIN: 8.6 g/dL — AB (ref 6.5–8.1)

## 2015-02-26 MED ORDER — GADOBENATE DIMEGLUMINE 529 MG/ML IV SOLN
10.0000 mL | Freq: Once | INTRAVENOUS | Status: AC | PRN
  Administered 2015-02-26: 8 mL via INTRAVENOUS

## 2015-02-26 MED ORDER — PREDNISONE 1 MG PO TABS: 1.0000 mg | ORAL_TABLET | Freq: Every day | ORAL | Status: DC

## 2015-02-26 NOTE — Discharge Instructions (Signed)
Call Dr. Sande Rives office first thing Monday morning. Speak with Larence Penning. Please let her know that Dr. Hal Neer wants to see you on Monday.

## 2015-02-26 NOTE — ED Provider Notes (Signed)
CSN: [id number]     Arrival date & time 02/26/15  1224 History   First MD Initiated Contact with Patient 02/26/15 1236     Chief Complaint  Patient presents with  . Loss of Vision     (Consider location/radiation/quality/duration/timing/severity/associated sxs/prior Treatment) Patient is a 24 y.o. female presenting with eye problem. The history is provided by the patient (pt complains of vission decrease laterally in both eyes.  she was seen by neurology today and it was rec. that she gets an Teacher, early years/pre).  Eye Problem Location:  Both Quality: not pain. Severity:  Mild Onset quality:  Gradual Timing:  Constant Progression:  Unchanged Chronicity:  New Associated symptoms: no discharge and no headaches     Past Medical History  Diagnosis Date  . Medical history non-contributory   . Pregnancy induced hypertension     end of preg  . Bartholin cyst   . Multiple sclerosis    Past Surgical History  Procedure Laterality Date  . No past surgeries     Family History  Problem Relation Age of Onset  . Hypertension Father   . Diabetes Maternal Grandmother   . Diabetes Paternal Grandmother    History  Substance Use Topics  . Smoking status: Never Smoker   . Smokeless tobacco: Never Used  . Alcohol Use: 0.0 oz/week    0 Standard drinks or equivalent per week     Comment: every other weekend   OB History    Gravida Para Term Preterm AB TAB SAB Ectopic Multiple Living   1 1 1  0 0 0 0 0 0 1     Review of Systems  Constitutional: Negative for appetite change and fatigue.  HENT: Negative for congestion, ear discharge and sinus pressure.   Eyes: Positive for visual disturbance. Negative for discharge.  Respiratory: Negative for cough.   Cardiovascular: Negative for chest pain.  Gastrointestinal: Negative for abdominal pain and diarrhea.  Genitourinary: Negative for frequency and hematuria.  Musculoskeletal: Negative for back pain.  Skin: Negative for rash.  Neurological: Negative  for seizures and headaches.  Psychiatric/Behavioral: Negative for hallucinations.      Allergies  Review of patient's allergies indicates no known allergies.  Home Medications   Prior to Admission medications   Medication Sig Start Date End Date Taking? Authorizing Provider  ibuprofen (ADVIL,MOTRIN) 200 MG tablet Take 600 mg by mouth every 6 (six) hours as needed for fever or headache.   Yes Historical Provider, MD  metroNIDAZOLE (FLAGYL) 500 MG tablet Take 500 mg by mouth 2 (two) times daily.   Yes Historical Provider, MD  etonogestrel-ethinyl estradiol (NUVARING) 0.12-0.015 MG/24HR vaginal ring Insert vaginally and leave in place for 3 consecutive weeks, then remove for 1 week. Patient not taking: Reported on 02/26/2015 12/25/14   Frazier Richards, MD  sulfamethoxazole-trimethoprim (BACTRIM DS,SEPTRA DS) 800-160 MG per tablet Take 1 tablet by mouth 2 (two) times daily. Take at the first sign of bartholin cyst recurrence Patient not taking: Reported on 02/26/2015 12/25/14   Frazier Richards, MD   BP 125/79 mmHg  Pulse 87  Temp(Src) 98.5 F (36.9 C) (Oral)  Resp 16  SpO2 100%  LMP 01/21/2015 (Exact Date) Physical Exam  Constitutional: She is oriented to person, place, and time. She appears well-developed.  HENT:  Head: Normocephalic.  Eyes: Conjunctivae and EOM are normal. No scleral icterus.  Pt has decrease in vision peripherally both eyes.  The upper peripheral quadrant she cannot see with either eye   Neck: Neck  supple. No thyromegaly present.  Cardiovascular: Normal rate and regular rhythm.  Exam reveals no gallop and no friction rub.   No murmur heard. Pulmonary/Chest: No stridor. She has no wheezes. She has no rales. She exhibits no tenderness.  Abdominal: She exhibits no distension. There is no tenderness. There is no rebound.  Musculoskeletal: Normal range of motion. She exhibits no edema.  Lymphadenopathy:    She has no cervical adenopathy.  Neurological: She is oriented to  person, place, and time. She exhibits normal muscle tone. Coordination normal.  Skin: No rash noted. No erythema.  Psychiatric: She has a normal mood and affect. Her behavior is normal.    ED Course  Procedures (including critical care time) Labs Review Labs Reviewed  COMPREHENSIVE METABOLIC PANEL - Abnormal; Notable for the following:    Total Protein 8.6 (*)    All other components within normal limits  CBC WITH DIFFERENTIAL/PLATELET    Imaging Review No results found.   EKG Interpretation None      MDM   Final diagnoses:  None  Decrease vision peripherally,  Mri pending.  Dr. Jeanell Sparrow to follow up mri results     Milton Ferguson, MD 02/26/15 [phone]

## 2015-02-26 NOTE — Progress Notes (Signed)
pcp is [facility]  [address] [place], [place] [address] [phone] 

## 2015-02-26 NOTE — ED Notes (Signed)
Pt states she went to the eye doctor for some upper peripheral vision loss yesterday. Was referred to a neurologist today and was informed she would need an MRI. States she was told by the insurance company that she wasn't covered for an MRI and that "if she needs an emergent MRI that the emergency room would be the best place to go." Denies pain, N/V/D, motor loss, confusion. States she has no other symptoms except the vision loss

## 2015-02-26 NOTE — Progress Notes (Signed)
NEUROLOGY CONSULTATION NOTE  Gloria Adams MRN: [id number] DOB: [birth year]  Referring provider: [place] Primary care provider: none  Reason for consult:  Bi-temporal vision loss  HISTORY OF PRESENT ILLNESS: Gloria Adams is a 24 year old right-handed woman who presents for pituitary tumor and peripheral vision loss.  About 2 or 3 days ago, she noticed difficulty seeing out of the temporal aspect of her visual field bilaterally.  She is really not sure how long this has been going on.  But when she covered each eye separately, it was more apparent..  She was evaluated by an optometrist at [facility].  As per report, her exam did reveal bilateral upper and lower temporal field loss.  She reports a previous eye exam a year ago, which was normal.  She denies headache, diplopia, nausea, lethargy, menorrhagia or dizziness.  PAST MEDICAL HISTORY: Past Medical History  Diagnosis Date  . Medical history non-contributory   . Pregnancy induced hypertension     end of preg  . Bartholin cyst   . Multiple sclerosis     PAST SURGICAL HISTORY: Past Surgical History  Procedure Laterality Date  . No past surgeries      MEDICATIONS: Current Outpatient Prescriptions on File Prior to Visit  Medication Sig Dispense Refill  . etonogestrel-ethinyl estradiol (NUVARING) 0.12-0.015 MG/24HR vaginal ring Insert vaginally and leave in place for 3 consecutive weeks, then remove for 1 week. 1 each 12  . ibuprofen (ADVIL,MOTRIN) 200 MG tablet Take 600 mg by mouth every 6 (six) hours as needed for fever or headache.    . metroNIDAZOLE (FLAGYL) 500 MG tablet Take 500 mg by mouth 2 (two) times daily.    Marland Kitchen sulfamethoxazole-trimethoprim (BACTRIM DS,SEPTRA DS) 800-160 MG per tablet Take 1 tablet by mouth 2 (two) times daily. Take at the first sign of bartholin cyst recurrence (Patient not taking: Reported on 02/26/2015) 14 tablet 0   No current facility-administered medications on file prior to visit.     ALLERGIES: No Known Allergies  FAMILY HISTORY: Family History  Problem Relation Age of Onset  . Hypertension Father   . Diabetes Maternal Grandmother   . Diabetes Paternal Grandmother     SOCIAL HISTORY: History   Social History  . Marital Status: Single    Spouse Name: N/A  . Number of Children: N/A  . Years of Education: N/A   Occupational History  . Not on file.   Social History Main Topics  . Smoking status: Never Smoker   . Smokeless tobacco: Never Used  . Alcohol Use: 0.0 oz/week    0 Standard drinks or equivalent per week     Comment: every other weekend  . Drug Use: No  . Sexual Activity:    Partners: Male    Birth Control/ Protection: None   Other Topics Concern  . Not on file   Social History Narrative    REVIEW OF SYSTEMS: Constitutional: No fevers, chills, or sweats, no generalized fatigue, change in appetite Eyes: No visual changes, double vision, eye pain Ear, nose and throat: No hearing loss, ear pain, nasal congestion, sore throat Cardiovascular: No chest pain, palpitations Respiratory:  No shortness of breath at rest or with exertion, wheezes GastrointestinaI: No nausea, vomiting, diarrhea, abdominal pain, fecal incontinence Genitourinary:  No dysuria, urinary retention or frequency Musculoskeletal:  No neck pain, back pain Integumentary: No rash, pruritus, skin lesions Neurological: as above Psychiatric: No depression, insomnia, anxiety Endocrine: No palpitations, fatigue, diaphoresis, mood swings, change in appetite,  change in weight, increased thirst Hematologic/Lymphatic:  No anemia, purpura, petechiae. Allergic/Immunologic: no itchy/runny eyes, nasal congestion, recent allergic reactions, rashes  PHYSICAL EXAM: Filed Vitals:   02/26/15 0936  BP: 118/64  Pulse: 68  Resp: 18   General: No acute distress.  Patient appears well-groomed.   Head:  Normocephalic/atraumatic Eyes:  fundi unremarkable, without vessel changes,  exudates, hemorrhages or papilledema. Neck: supple, no paraspinal tenderness, full range of motion Back: No paraspinal tenderness Heart: regular rate and rhythm Lungs: Clear to auscultation bilaterally. Vascular: No carotid bruits. Neurological Exam: Mental status: alert and oriented to person, place, and time, recent and remote memory intact, fund of knowledge intact, attention and concentration intact, speech fluent and not dysarthric, language intact. Cranial nerves: CN I: not tested CN II: pupils equal, round and reactive to light, bilateral temporal field loss, more prominent in upper quandrants, fundi unremarkable, without vessel changes, exudates, hemorrhages or papilledema. CN III, IV, VI:  full range of motion, no nystagmus, no ptosis CN V: facial sensation intact CN VII: upper and lower face symmetric CN VIII: hearing intact CN IX, X: gag intact, uvula midline CN XI: sternocleidomastoid and trapezius muscles intact CN XII: tongue midline Bulk & Tone: normal, no fasciculations. Motor:  5/5 throughout Sensation:  Temperature and vibration intact Deep Tendon Reflexes:  2+ throughout, toes equivocal Finger to nose testing:  intact Heel to shin:  intact Gait:  Normal station and stride.  Able to turn and walk in tandem. Romberg negative.  IMPRESSION: Bitemporal hemianopsia.  Must rule out pituitary tumor or other intracranial mass lesion.  PLAN: She needs MRI of brain with and without contrast with attention to the pituitary gland, as well as MRA of the head Unfortunately, due to her insurance, I am not able to order the MRIs.  On her Medicaid card, [employer] is listed but no specific provider and she has never seen a PCP.  Therefore, she will need to contact them for an appointment.  In the meantime, we will send our note to [place] with our recommendations.  While waiting for her appointment with the PCP, if she should notice worsening vision, headaches or  nausea, she was advised to go to the ED for further evaluation and performance of the above images.  45 minutes spent face to face with patient, over 50% spent discussing possible diagnoses and plan.  Thank you for allowing me to take part in the care of this patient.  Metta Clines, DO  CC:  [facility]

## 2015-02-26 NOTE — Patient Instructions (Signed)
To evaluate the vision problems, we need to get an MRI of the brain with and without contrast with attention to the pituitary gland.  I would also like to get an MRA of the head without contrast as well.  Due to your insurance, I am not able to order those tests.  So we will send my note with my recommendations to [facility] and when you see the primary care provider, they will have to order those tests.  Further recommendations pending results.  If before your appointment, you feel that your vision gets worse or are having other symptoms such as headache or nausea, I recommend going to the ED further evaluation and to get the MRIs performed.

## 2015-02-26 NOTE — ED Notes (Signed)
Patient off floor for testing.

## 2015-02-26 NOTE — ED Provider Notes (Signed)
Patient signed out to me pending results of MRI. MRI has results and I have reviewed results. Patient care discussed with Dr. Hal Neer. He advises that he will see the patient in the office on Monday. She is to call and speak with his secretary Nile Dear to come in to be seen on Monday. She'll be placed on prednisone 2 mg 3 times a day in interim. I discussed these results and plan with patient and she voices understanding of plan for close follow-up  Pattricia Boss, MD 02/26/15 1712

## 2015-03-11 ENCOUNTER — Other Ambulatory Visit: Payer: Self-pay | Admitting: Neurosurgery

## 2015-03-16 ENCOUNTER — Other Ambulatory Visit: Payer: Self-pay | Admitting: Otolaryngology

## 2015-03-16 NOTE — H&P (Signed)
  PREOPERATIVE H&P  Chief Complaint: Pituitary tumor  HPI: Gloria Adams is a 24 y.o. female who presents for evaluation of a pituitary tumor. She had noticed some decrease of her peripheral vision. 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 I was consulted with by Dr Hal Neer to assist in transsphenoidal resection of the tumor.  Past Medical History  Diagnosis Date  . Medical history non-contributory   . Pregnancy induced hypertension     end of preg  . Bartholin cyst   . Multiple sclerosis    Past Surgical History  Procedure Laterality Date  . No past surgeries     History   Social History  . Marital Status: Single    Spouse Name: N/A  . Number of Children: N/A  . Years of Education: N/A   Social History Main Topics  . Smoking status: Never Smoker   . Smokeless tobacco: Never Used  . Alcohol Use: 0.0 oz/week    0 Standard drinks or equivalent per week     Comment: every other weekend  . Drug Use: No  . Sexual Activity:    Partners: Male    Birth Control/ Protection: None   Other Topics Concern  . Not on file   Social History Narrative   Family History  Problem Relation Age of Onset  . Hypertension Father   . Diabetes Maternal Grandmother   . Diabetes Paternal Grandmother    No Known Allergies Prior to Admission medications   Medication Sig Start Date End Date Taking? Authorizing Provider  etonogestrel-ethinyl estradiol (NUVARING) 0.12-0.015 MG/24HR vaginal ring Insert vaginally and leave in place for 3 consecutive weeks, then remove for 1 week. Patient not taking: Reported on 02/26/2015 12/25/14   Frazier Richards, MD  ibuprofen (ADVIL,MOTRIN) 200 MG tablet Take 600 mg by mouth every 6 (six) hours as needed for fever or headache.    Historical Provider, MD  metroNIDAZOLE (FLAGYL) 500 MG tablet Take 500 mg by mouth 2 (two) times  daily.    Historical Provider, MD  predniSONE (DELTASONE) 1 MG tablet Take 1 tablet (1 mg total) by mouth daily with breakfast. Take two tablets three times per day 02/26/15   Pattricia Boss, MD  sulfamethoxazole-trimethoprim (BACTRIM DS,SEPTRA DS) 800-160 MG per tablet Take 1 tablet by mouth 2 (two) times daily. Take at the first sign of bartholin cyst recurrence Patient not taking: Reported on 02/26/2015 12/25/14   Frazier Richards, MD     Positive ROS: Per HPI  All other systems have been reviewed and were otherwise negative with the exception of those mentioned in the HPI and as above.  Physical Exam: There were no vitals filed for this visit.  General: Alert, no acute distress Oral: Normal oral mucosa and tonsils Nasal: Clear nasal passages. Septum midline Neck: No palpable adenopathy or thyroid nodules Ear: Ear canal is clear with normal appearing TMs Cardiovascular: Regular rate and rhythm, no murmur.  Respiratory: Clear to auscultation Neurologic: Alert and oriented x 3   Assessment/Plan: Pituitary Tumor Plan for Procedure(s): CRANIOTOMY HYPOPHYSECTOMY TRANSNASAL APPROACH TRANSNASAL APPROACH   Melony Overly, MD 03/16/2015 3:19 PM

## 2015-03-19 ENCOUNTER — Other Ambulatory Visit (HOSPITAL_COMMUNITY): Payer: Self-pay

## 2015-03-19 ENCOUNTER — Encounter (HOSPITAL_COMMUNITY): Payer: Self-pay

## 2015-03-19 ENCOUNTER — Encounter (HOSPITAL_COMMUNITY)
Admission: RE | Admit: 2015-03-19 | Discharge: 2015-03-19 | Disposition: A | Discharge status: Home / Self Care | Payer: Medicaid Other | Source: Ambulatory Visit | Attending: Neurosurgery | Admitting: Neurosurgery

## 2015-03-19 DIAGNOSIS — Z0183 Encounter for blood typing: Secondary | ICD-10-CM | POA: Diagnosis not present

## 2015-03-19 DIAGNOSIS — E237 Disorder of pituitary gland, unspecified: Secondary | ICD-10-CM | POA: Insufficient documentation

## 2015-03-19 DIAGNOSIS — Z01812 Encounter for preprocedural laboratory examination: Secondary | ICD-10-CM | POA: Insufficient documentation

## 2015-03-19 LAB — CBC
HCT: 39.5 % (ref 36.0–46.0)
HEMOGLOBIN: 13.1 g/dL (ref 12.0–15.0)
MCH: 27.6 pg (ref 26.0–34.0)
MCHC: 33.2 g/dL (ref 30.0–36.0)
MCV: 83.2 fL (ref 78.0–100.0)
Platelets: 333 10*3/uL (ref 150–400)
RBC: 4.75 MIL/uL (ref 3.87–5.11)
RDW: 14.8 % (ref 11.5–15.5)
WBC: 6.6 10*3/uL (ref 4.0–10.5)

## 2015-03-19 LAB — SURGICAL PCR SCREEN
MRSA, PCR: NEGATIVE
Staphylococcus aureus: NEGATIVE

## 2015-03-19 LAB — BASIC METABOLIC PANEL
Anion gap: 9 (ref 5–15)
BUN: 8 mg/dL (ref 6–20)
CALCIUM: 9.4 mg/dL (ref 8.9–10.3)
CO2: 26 mmol/L (ref 22–32)
CREATININE: 0.69 mg/dL (ref 0.44–1.00)
Chloride: 104 mmol/L (ref 101–111)
GFR calc Af Amer: >60 mL/min (ref >60)
GLUCOSE: 93 mg/dL (ref 65–99)
Potassium: 3.8 mmol/L (ref 3.5–5.1)
Sodium: 139 mmol/L (ref 135–145)

## 2015-03-19 LAB — HCG, SERUM, QUALITATIVE: Preg, Serum: NEGATIVE

## 2015-03-19 LAB — ABO/RH: ABO/RH(D): O POS

## 2015-03-19 LAB — TYPE AND SCREEN
ABO/RH(D): O POS
Antibody Screen: NEGATIVE

## 2015-03-19 NOTE — Pre-Procedure Instructions (Signed)
    Gloria Adams  03/19/2015      [id number] - [place], [place] [address] AT [facility] OF [address] [place] [address] Phone: [phone] Fax: [phone]    Your procedure is scheduled on Tuesday, March 30, 2015  Report to [facility] Admitting at 5:30 A.M.  Call this number if you have problems the morning of surgery:  [phone]   Remember:  Do not eat food or drink liquids after midnight Monday, March 29, 2015  Take these medicines the morning of surgery with A SIP OF WATER : None  Stop taking Aspirin, vitamins, and herbal medications. Do not take any NSAIDs ie: Ibuprofen, Advil, Naproxen or any medication containing Aspirin; stop 1 week prior to procedure ( Tuesday, March 23, 2015)  Do not wear jewelry, make-up or nail polish.  Do not wear lotions, powders, or perfumes.  You may not wear deodorant.  Do not shave 48 hours prior to surgery.    Do not bring valuables to the hospital.  [facility] is not responsible for any belongings or valuables.  Contacts, dentures or bridgework may not be worn into surgery.  Leave your suitcase in the car.  After surgery it may be brought to your room.  For patients admitted to the hospital, discharge time will be determined by your treatment team.  Patients discharged the day of surgery will not be allowed to drive home.   Name and phone number of your driver:    Special instructions: Shower the night before surgery and the morning of surgery using CHG.  Please read over the following fact sheets that you were given. Pain Booklet, Coughing and Deep Breathing and Surgical Site Infection Prevention

## 2015-03-29 MED ORDER — CEFAZOLIN SODIUM-DEXTROSE 2-3 GM-% IV SOLR
2.0000 g | INTRAVENOUS | Status: AC
  Administered 2015-03-30: 2 g via INTRAVENOUS
  Filled 2015-03-29 (×2): qty 50

## 2015-03-29 MED ORDER — DEXAMETHASONE SODIUM PHOSPHATE 10 MG/ML IJ SOLN
10.0000 mg | INTRAMUSCULAR | Status: AC
  Administered 2015-03-30: 10 mg via INTRAVENOUS
  Filled 2015-03-29 (×2): qty 1

## 2015-03-30 ENCOUNTER — Inpatient Hospital Stay (HOSPITAL_COMMUNITY): Payer: Medicaid Other | Admitting: Anesthesiology

## 2015-03-30 ENCOUNTER — Encounter (HOSPITAL_COMMUNITY): Payer: Self-pay | Admitting: Surgery

## 2015-03-30 ENCOUNTER — Inpatient Hospital Stay (HOSPITAL_COMMUNITY)
Admission: RE | Admit: 2015-03-30 | Discharge: 2015-04-02 | DRG: 614 | Disposition: A | Discharge status: Home / Self Care | Payer: Medicaid Other | Source: Ambulatory Visit | Attending: Neurosurgery | Admitting: Neurosurgery

## 2015-03-30 ENCOUNTER — Encounter (HOSPITAL_COMMUNITY)
Admission: RE | Disposition: A | Discharge status: Home / Self Care | Payer: Self-pay | Source: Ambulatory Visit | Attending: Neurosurgery

## 2015-03-30 DIAGNOSIS — I10 Essential (primary) hypertension: Secondary | ICD-10-CM | POA: Diagnosis present

## 2015-03-30 DIAGNOSIS — E236 Other disorders of pituitary gland: Secondary | ICD-10-CM | POA: Diagnosis present

## 2015-03-30 DIAGNOSIS — E232 Diabetes insipidus: Secondary | ICD-10-CM | POA: Diagnosis present

## 2015-03-30 DIAGNOSIS — H547 Unspecified visual loss: Secondary | ICD-10-CM | POA: Diagnosis present

## 2015-03-30 DIAGNOSIS — D497 Neoplasm of unspecified behavior of endocrine glands and other parts of nervous system: Secondary | ICD-10-CM | POA: Diagnosis present

## 2015-03-30 DIAGNOSIS — G35 Multiple sclerosis: Secondary | ICD-10-CM | POA: Diagnosis present

## 2015-03-30 DIAGNOSIS — D352 Benign neoplasm of pituitary gland: Secondary | ICD-10-CM | POA: Diagnosis present

## 2015-03-30 DIAGNOSIS — E869 Volume depletion, unspecified: Secondary | ICD-10-CM | POA: Diagnosis not present

## 2015-03-30 HISTORY — PX: TRANSNASAL APPROACH: SHX6149

## 2015-03-30 HISTORY — PX: CRANIOTOMY: SHX93

## 2015-03-30 LAB — CBC
HEMATOCRIT: 36.8 % (ref 36.0–46.0)
Hemoglobin: 12.3 g/dL (ref 12.0–15.0)
MCH: 27.8 pg (ref 26.0–34.0)
MCHC: 33.4 g/dL (ref 30.0–36.0)
MCV: 83.1 fL (ref 78.0–100.0)
PLATELETS: 296 10*3/uL (ref 150–400)
RBC: 4.43 MIL/uL (ref 3.87–5.11)
RDW: 15 % (ref 11.5–15.5)
WBC: 12.8 10*3/uL — AB (ref 4.0–10.5)

## 2015-03-30 LAB — SODIUM: Sodium: 141 mmol/L (ref 135–145)

## 2015-03-30 LAB — BASIC METABOLIC PANEL
Anion gap: 7 (ref 5–15)
BUN: 5 mg/dL — AB (ref 6–20)
CHLORIDE: 105 mmol/L (ref 101–111)
CO2: 21 mmol/L — ABNORMAL LOW (ref 22–32)
CREATININE: 0.5 mg/dL (ref 0.44–1.00)
Calcium: 8.5 mg/dL — ABNORMAL LOW (ref 8.9–10.3)
GFR calc Af Amer: >60 mL/min (ref >60)
GFR calc non Af Amer: >60 mL/min (ref >60)
GLUCOSE: 124 mg/dL — AB (ref 65–99)
Potassium: 4 mmol/L (ref 3.5–5.1)
SODIUM: 133 mmol/L — AB (ref 135–145)

## 2015-03-30 SURGERY — CRANIOTOMY HYPOPHYSECTOMY TRANSNASAL APPROACH
Anesthesia: General | Site: Nose

## 2015-03-30 MED ORDER — BISACODYL 5 MG PO TBEC
5.0000 mg | DELAYED_RELEASE_TABLET | Freq: Every day | ORAL | Status: DC | PRN
Start: 2015-03-30 — End: 2015-04-02

## 2015-03-30 MED ORDER — ONDANSETRON HCL 4 MG PO TABS: 4.0000 mg | ORAL_TABLET | ORAL | Status: DC | PRN

## 2015-03-30 MED ORDER — ROCURONIUM BROMIDE 50 MG/5ML IV SOLN
INTRAVENOUS | Status: AC
  Filled 2015-03-30: qty 1

## 2015-03-30 MED ORDER — CEFAZOLIN SODIUM 1-5 GM-% IV SOLN
1.0000 g | Freq: Three times a day (TID) | INTRAVENOUS | Status: AC
  Administered 2015-03-30 (×2): 1 g via INTRAVENOUS
  Filled 2015-03-30 (×2): qty 50

## 2015-03-30 MED ORDER — LABETALOL HCL 5 MG/ML IV SOLN
10.0000 mg | INTRAVENOUS | Status: DC | PRN
Start: 2015-03-30 — End: 2015-04-02

## 2015-03-30 MED ORDER — MICROFIBRILLAR COLL HEMOSTAT EX PADS
MEDICATED_PAD | CUTANEOUS | Status: DC | PRN
  Administered 2015-03-30: 1 via TOPICAL

## 2015-03-30 MED ORDER — MANNITOL 25 % IV SOLN
INTRAVENOUS | Status: AC
  Filled 2015-03-30: qty 50

## 2015-03-30 MED ORDER — VECURONIUM BROMIDE 10 MG IV SOLR
INTRAVENOUS | Status: DC | PRN
  Administered 2015-03-30: 2 mg via INTRAVENOUS
  Administered 2015-03-30: 1 mg via INTRAVENOUS

## 2015-03-30 MED ORDER — SODIUM CHLORIDE 0.9 % IR SOLN
Status: DC | PRN
  Administered 2015-03-30: 500 mL

## 2015-03-30 MED ORDER — HYDROMORPHONE HCL 1 MG/ML IJ SOLN
0.5000 mg | INTRAMUSCULAR | Status: DC | PRN
  Administered 2015-03-30 – 2015-03-31 (×6): 1 mg via INTRAVENOUS
  Administered 2015-04-01: 0.5 mg via INTRAVENOUS
  Administered 2015-04-01 (×2): 1 mg via INTRAVENOUS
  Administered 2015-04-01: 0.5 mg via INTRAVENOUS
  Filled 2015-03-30 (×10): qty 1

## 2015-03-30 MED ORDER — SODIUM CHLORIDE 0.9 % IV SOLN
500.0000 mg | Freq: Two times a day (BID) | INTRAVENOUS | Status: DC
  Administered 2015-03-30 – 2015-04-01 (×6): 500 mg via INTRAVENOUS
  Filled 2015-03-30 (×8): qty 5

## 2015-03-30 MED ORDER — DEXAMETHASONE SODIUM PHOSPHATE 10 MG/ML IJ SOLN
6.0000 mg | Freq: Four times a day (QID) | INTRAMUSCULAR | Status: AC
  Administered 2015-03-30 – 2015-03-31 (×4): 6 mg via INTRAVENOUS
  Filled 2015-03-30 (×4): qty 1

## 2015-03-30 MED ORDER — ONDANSETRON HCL 4 MG/2ML IJ SOLN
INTRAMUSCULAR | Status: AC
  Filled 2015-03-30: qty 2

## 2015-03-30 MED ORDER — SODIUM CHLORIDE 0.9 % IJ SOLN
INTRAMUSCULAR | Status: AC
  Filled 2015-03-30: qty 10

## 2015-03-30 MED ORDER — HEMOSTATIC AGENTS (NO CHARGE) OPTIME
TOPICAL | Status: DC | PRN
  Administered 2015-03-30: 1 via TOPICAL

## 2015-03-30 MED ORDER — PHENYLEPHRINE 40 MCG/ML (10ML) SYRINGE FOR IV PUSH (FOR BLOOD PRESSURE SUPPORT)
PREFILLED_SYRINGE | INTRAVENOUS | Status: AC
  Filled 2015-03-30: qty 10

## 2015-03-30 MED ORDER — KCL IN DEXTROSE-NACL 20-5-0.45 MEQ/L-%-% IV SOLN
INTRAVENOUS | Status: DC
  Administered 2015-03-30 – 2015-04-01 (×4): via INTRAVENOUS
  Filled 2015-03-30 (×5): qty 1000

## 2015-03-30 MED ORDER — OXYMETAZOLINE HCL 0.05 % NA SOLN
NASAL | Status: DC | PRN
  Administered 2015-03-30: 1

## 2015-03-30 MED ORDER — DEXAMETHASONE SODIUM PHOSPHATE 4 MG/ML IJ SOLN
4.0000 mg | Freq: Four times a day (QID) | INTRAMUSCULAR | Status: AC
  Administered 2015-03-31 – 2015-04-01 (×4): 4 mg via INTRAVENOUS
  Filled 2015-03-30 (×4): qty 1

## 2015-03-30 MED ORDER — PHENYLEPHRINE HCL 10 MG/ML IJ SOLN
INTRAMUSCULAR | Status: DC | PRN
  Administered 2015-03-30 (×2): 80 ug via INTRAVENOUS
  Administered 2015-03-30: 40 ug via INTRAVENOUS

## 2015-03-30 MED ORDER — ACETAMINOPHEN 650 MG RE SUPP: 650.0000 mg | RECTAL | Status: DC | PRN

## 2015-03-30 MED ORDER — DESMOPRESSIN ACETATE 4 MCG/ML IJ SOLN
0.1000 ug | Freq: Once | INTRAMUSCULAR | Status: DC
  Filled 2015-03-30: qty 0.03

## 2015-03-30 MED ORDER — BACITRACIN-NEOMYCIN-POLYMYXIN 400-5-5000 EX OINT
TOPICAL_OINTMENT | CUTANEOUS | Status: DC | PRN
  Administered 2015-03-30: 1 via TOPICAL

## 2015-03-30 MED ORDER — STERILE WATER FOR INJECTION IJ SOLN
INTRAMUSCULAR | Status: AC
  Filled 2015-03-30: qty 10

## 2015-03-30 MED ORDER — FENTANYL CITRATE (PF) 100 MCG/2ML IJ SOLN
INTRAMUSCULAR | Status: DC | PRN
  Administered 2015-03-30: 50 ug via INTRAVENOUS
  Administered 2015-03-30: 75 ug via INTRAVENOUS
  Administered 2015-03-30: 50 ug via INTRAVENOUS
  Administered 2015-03-30: 25 ug via INTRAVENOUS
  Administered 2015-03-30: 50 ug via INTRAVENOUS

## 2015-03-30 MED ORDER — PROPOFOL 10 MG/ML IV BOLUS
INTRAVENOUS | Status: AC
  Filled 2015-03-30: qty 20

## 2015-03-30 MED ORDER — GLYCOPYRROLATE 0.2 MG/ML IJ SOLN
INTRAMUSCULAR | Status: DC | PRN
  Administered 2015-03-30: 0.6 mg via INTRAVENOUS

## 2015-03-30 MED ORDER — PROMETHAZINE HCL 25 MG PO TABS: 12.5000 mg | ORAL_TABLET | ORAL | Status: DC | PRN

## 2015-03-30 MED ORDER — FENTANYL CITRATE (PF) 250 MCG/5ML IJ SOLN
INTRAMUSCULAR | Status: AC
  Filled 2015-03-30: qty 5

## 2015-03-30 MED ORDER — ACETAMINOPHEN 325 MG PO TABS
650.0000 mg | ORAL_TABLET | ORAL | Status: DC | PRN
  Administered 2015-03-31 – 2015-04-01 (×2): 650 mg via ORAL
  Filled 2015-03-30 (×4): qty 2

## 2015-03-30 MED ORDER — SUCCINYLCHOLINE CHLORIDE 20 MG/ML IJ SOLN
INTRAMUSCULAR | Status: AC
  Filled 2015-03-30: qty 1

## 2015-03-30 MED ORDER — DEXAMETHASONE SODIUM PHOSPHATE 4 MG/ML IJ SOLN
INTRAMUSCULAR | Status: AC
  Filled 2015-03-30: qty 2

## 2015-03-30 MED ORDER — BACITRACIN ZINC 500 UNIT/GM EX OINT
TOPICAL_OINTMENT | CUTANEOUS | Status: DC | PRN
  Administered 2015-03-30: 1 via TOPICAL

## 2015-03-30 MED ORDER — DEXAMETHASONE SODIUM PHOSPHATE 4 MG/ML IJ SOLN
4.0000 mg | Freq: Three times a day (TID) | INTRAMUSCULAR | Status: DC
  Filled 2015-03-30 (×3): qty 1

## 2015-03-30 MED ORDER — LIDOCAINE HCL (CARDIAC) 20 MG/ML IV SOLN
INTRAVENOUS | Status: DC | PRN
  Administered 2015-03-30: 100 mg via INTRAVENOUS

## 2015-03-30 MED ORDER — HYDROMORPHONE HCL 1 MG/ML IJ SOLN
INTRAMUSCULAR | Status: AC
  Administered 2015-03-30: 0.25 mg via INTRAVENOUS
  Filled 2015-03-30: qty 1

## 2015-03-30 MED ORDER — THROMBIN 5000 UNITS EX SOLR
CUTANEOUS | Status: DC | PRN
  Administered 2015-03-30: 5000 [IU] via TOPICAL

## 2015-03-30 MED ORDER — SODIUM CHLORIDE 0.9 % IV SOLN
INTRAVENOUS | Status: DC | PRN
  Administered 2015-03-30 (×3): via INTRAVENOUS

## 2015-03-30 MED ORDER — NALOXONE HCL 0.4 MG/ML IJ SOLN: 0.0800 mg | INTRAMUSCULAR | Status: DC | PRN

## 2015-03-30 MED ORDER — PROMETHAZINE HCL 25 MG/ML IJ SOLN: 6.2500 mg | INTRAMUSCULAR | Status: DC | PRN

## 2015-03-30 MED ORDER — EPHEDRINE SULFATE 50 MG/ML IJ SOLN
INTRAMUSCULAR | Status: AC
  Filled 2015-03-30: qty 1

## 2015-03-30 MED ORDER — PANTOPRAZOLE SODIUM 40 MG IV SOLR
40.0000 mg | Freq: Every day | INTRAVENOUS | Status: DC
  Administered 2015-03-30 – 2015-03-31 (×2): 40 mg via INTRAVENOUS
  Filled 2015-03-30 (×3): qty 40

## 2015-03-30 MED ORDER — SCOPOLAMINE 1 MG/3DAYS TD PT72
MEDICATED_PATCH | TRANSDERMAL | Status: AC
  Filled 2015-03-30: qty 1

## 2015-03-30 MED ORDER — PHENYLEPHRINE HCL 10 MG/ML IJ SOLN
10.0000 mg | INTRAVENOUS | Status: DC | PRN
  Administered 2015-03-30: 15 ug/min via INTRAVENOUS

## 2015-03-30 MED ORDER — LIDOCAINE HCL (CARDIAC) 20 MG/ML IV SOLN
INTRAVENOUS | Status: AC
  Filled 2015-03-30: qty 5

## 2015-03-30 MED ORDER — LIDOCAINE HCL 4 % MT SOLN
OROMUCOSAL | Status: DC | PRN
  Administered 2015-03-30: 4 mL via TOPICAL

## 2015-03-30 MED ORDER — MIDAZOLAM HCL 2 MG/2ML IJ SOLN
INTRAMUSCULAR | Status: AC
  Filled 2015-03-30: qty 4

## 2015-03-30 MED ORDER — SCOPOLAMINE 1 MG/3DAYS TD PT72
MEDICATED_PATCH | TRANSDERMAL | Status: AC
  Administered 2015-03-30: 1 via TRANSDERMAL
  Filled 2015-03-30: qty 1

## 2015-03-30 MED ORDER — HYDROMORPHONE HCL 1 MG/ML IJ SOLN
0.2500 mg | INTRAMUSCULAR | Status: DC | PRN
  Administered 2015-03-30 (×3): 0.25 mg via INTRAVENOUS

## 2015-03-30 MED ORDER — DOCUSATE SODIUM 100 MG PO CAPS
100.0000 mg | ORAL_CAPSULE | Freq: Two times a day (BID) | ORAL | Status: DC
  Administered 2015-03-30 – 2015-04-02 (×3): 100 mg via ORAL
  Filled 2015-03-30 (×8): qty 1

## 2015-03-30 MED ORDER — VECURONIUM BROMIDE 10 MG IV SOLR
INTRAVENOUS | Status: AC
  Filled 2015-03-30: qty 10

## 2015-03-30 MED ORDER — PROPOFOL 10 MG/ML IV BOLUS
INTRAVENOUS | Status: DC | PRN
  Administered 2015-03-30: 10 mg via INTRAVENOUS
  Administered 2015-03-30: 30 mg via INTRAVENOUS
  Administered 2015-03-30: 40 mg via INTRAVENOUS
  Administered 2015-03-30: 10 mg via INTRAVENOUS
  Administered 2015-03-30: 30 mg via INTRAVENOUS
  Administered 2015-03-30: 150 mg via INTRAVENOUS
  Administered 2015-03-30: 10 mg via INTRAVENOUS

## 2015-03-30 MED ORDER — LIDOCAINE-EPINEPHRINE 1 %-1:100000 IJ SOLN
INTRAMUSCULAR | Status: DC | PRN
  Administered 2015-03-30: 8 mL

## 2015-03-30 MED ORDER — ONDANSETRON HCL 4 MG/2ML IJ SOLN: 4.0000 mg | INTRAMUSCULAR | Status: DC | PRN

## 2015-03-30 MED ORDER — ONDANSETRON HCL 4 MG/2ML IJ SOLN
INTRAMUSCULAR | Status: DC | PRN
  Administered 2015-03-30: 4 mg via INTRAVENOUS

## 2015-03-30 MED ORDER — 0.9 % SODIUM CHLORIDE (POUR BTL) OPTIME
TOPICAL | Status: DC | PRN
  Administered 2015-03-30: 1000 mL

## 2015-03-30 MED ORDER — HYDROCODONE-ACETAMINOPHEN 5-325 MG PO TABS
1.0000 | ORAL_TABLET | ORAL | Status: DC | PRN
  Administered 2015-03-31 – 2015-04-02 (×7): 1 via ORAL
  Filled 2015-03-30 (×7): qty 1

## 2015-03-30 MED ORDER — NEOSTIGMINE METHYLSULFATE 10 MG/10ML IV SOLN
INTRAVENOUS | Status: DC | PRN
  Administered 2015-03-30: 4 mg via INTRAVENOUS

## 2015-03-30 MED ORDER — ROCURONIUM BROMIDE 100 MG/10ML IV SOLN
INTRAVENOUS | Status: DC | PRN
  Administered 2015-03-30: 50 mg via INTRAVENOUS

## 2015-03-30 SURGICAL SUPPLY — 98 items
5-0 PLAIN GUT 18" P-3 ×4 IMPLANT
ATTRACTOMAT 16X20 MAGNETIC DRP (DRAPES) IMPLANT
BAG DECANTER FOR FLEXI CONT (MISCELLANEOUS) ×4 IMPLANT
BENZOIN TINCTURE PRP APPL 2/3 (GAUZE/BANDAGES/DRESSINGS) IMPLANT
BLADE SURG 10 STRL SS (BLADE) ×4 IMPLANT
BLADE SURG 11 STRL SS (BLADE) ×8 IMPLANT
BLADE SURG 15 STRL LF DISP TIS (BLADE) ×4 IMPLANT
BLADE SURG 15 STRL SS (BLADE) ×4
BRUSH SCRUB EZ PLAIN DRY (MISCELLANEOUS) ×4 IMPLANT
CANISTER SUCT 3000ML PPV (MISCELLANEOUS) ×4 IMPLANT
CLEANER TIP ELECTROSURG 2X2 (MISCELLANEOUS) IMPLANT
CLOSURE WOUND 1/2 X4 (GAUZE/BANDAGES/DRESSINGS)
COAGULATOR SUCT 8FR VV (MISCELLANEOUS) IMPLANT
CONT SPEC STER OR (MISCELLANEOUS) ×12 IMPLANT
CORDS BIPOLAR (ELECTRODE) ×4 IMPLANT
COVER BACK TABLE 60X90IN (DRAPES) ×4 IMPLANT
COVER MAYO STAND STRL (DRAPES) ×4 IMPLANT
DRAPE EENT ADH APERT 15X15 STR (DRAPES) ×4 IMPLANT
DRAPE INCISE IOBAN 66X45 STRL (DRAPES) ×4 IMPLANT
DRAPE MICROSCOPE LEICA (MISCELLANEOUS) ×4 IMPLANT
DRAPE POUCH INSTRU U-SHP 10X18 (DRAPES) IMPLANT
DRAPE PROXIMA HALF (DRAPES) IMPLANT
DRAPE SURG 17X23 STRL (DRAPES) IMPLANT
DRESSING NASAL POPE 10X1.5X2.5 (GAUZE/BANDAGES/DRESSINGS) ×4 IMPLANT
DRSG NASAL POPE 10X1.5X2.5 (GAUZE/BANDAGES/DRESSINGS) ×8
DRSG OPSITE POSTOP 3X4 (GAUZE/BANDAGES/DRESSINGS) ×4 IMPLANT
ELECT COATED BLADE 2.86 ST (ELECTRODE) IMPLANT
ELECT NEEDLE TIP 2.8 STRL (NEEDLE) IMPLANT
ELECT REM PT RETURN 9FT ADLT (ELECTROSURGICAL) ×4
ELECTRODE REM PT RTRN 9FT ADLT (ELECTROSURGICAL) ×2 IMPLANT
GAUZE PACKING FOLDED 2  STR (GAUZE/BANDAGES/DRESSINGS)
GAUZE PACKING FOLDED 2 STR (GAUZE/BANDAGES/DRESSINGS) IMPLANT
GAUZE SPONGE 2X2 8PLY STRL LF (GAUZE/BANDAGES/DRESSINGS) ×2 IMPLANT
GAUZE SPONGE 4X4 12PLY STRL (GAUZE/BANDAGES/DRESSINGS) IMPLANT
GAUZE SPONGE 4X4 16PLY XRAY LF (GAUZE/BANDAGES/DRESSINGS) ×4 IMPLANT
GLOVE BIO SURGEON STRL SZ7.5 (GLOVE) ×8 IMPLANT
GLOVE BIOGEL PI IND STRL 7.0 (GLOVE) ×6 IMPLANT
GLOVE BIOGEL PI INDICATOR 7.0 (GLOVE) ×6
GLOVE ECLIPSE 8.0 STRL XLNG CF (GLOVE) ×12 IMPLANT
GLOVE EXAM NITRILE LRG STRL (GLOVE) IMPLANT
GLOVE EXAM NITRILE XL STR (GLOVE) IMPLANT
GLOVE EXAM NITRILE XS STR PU (GLOVE) IMPLANT
GLOVE SS BIOGEL STRL SZ 7.5 (GLOVE) IMPLANT
GLOVE SUPERSENSE BIOGEL SZ 7.5 (GLOVE)
GOWN PREVENTION PLUS XLARGE (GOWN DISPOSABLE) IMPLANT
GOWN STRL NON-REIN LRG LVL3 (GOWN DISPOSABLE) IMPLANT
GOWN STRL REUS W/ TWL LRG LVL3 (GOWN DISPOSABLE) ×2 IMPLANT
GOWN STRL REUS W/ TWL XL LVL3 (GOWN DISPOSABLE) ×8 IMPLANT
GOWN STRL REUS W/TWL 2XL LVL3 (GOWN DISPOSABLE) IMPLANT
GOWN STRL REUS W/TWL LRG LVL3 (GOWN DISPOSABLE) ×2
GOWN STRL REUS W/TWL XL LVL3 (GOWN DISPOSABLE) ×8
HEMOSTAT SURGICEL 2X14 (HEMOSTASIS) ×4 IMPLANT
KIT BASIN OR (CUSTOM PROCEDURE TRAY) ×4 IMPLANT
KIT DRAIN CSF ACCUDRAIN (MISCELLANEOUS) IMPLANT
KIT ROOM TURNOVER OR (KITS) ×4 IMPLANT
NEEDLE 27GAX1X1/2 (NEEDLE) IMPLANT
NEEDLE HYPO 25X1 1.5 SAFETY (NEEDLE) ×4 IMPLANT
NEEDLE SPNL 22GX3.5 QUINCKE BK (NEEDLE) IMPLANT
NS IRRIG 1000ML POUR BTL (IV SOLUTION) ×4 IMPLANT
PAD ARMBOARD 7.5X6 YLW CONV (MISCELLANEOUS) ×16 IMPLANT
PATTIES SURGICAL .25X.25 (GAUZE/BANDAGES/DRESSINGS) IMPLANT
PATTIES SURGICAL .5 X.5 (GAUZE/BANDAGES/DRESSINGS) ×4 IMPLANT
PATTIES SURGICAL .5 X3 (DISPOSABLE) ×4 IMPLANT
PENCIL BUTTON HOLSTER BLD 10FT (ELECTRODE) ×4 IMPLANT
PENCIL FOOT CONTROL (ELECTRODE) IMPLANT
RUBBERBAND STERILE (MISCELLANEOUS) ×8 IMPLANT
SHEET SIL 040 (INSTRUMENTS) IMPLANT
SPLINT NASAL DOYLE BI-VL (GAUZE/BANDAGES/DRESSINGS) ×4 IMPLANT
SPONGE GAUZE 2X2 STER 10/PKG (GAUZE/BANDAGES/DRESSINGS) ×2
SPONGE LAP 4X18 X RAY DECT (DISPOSABLE) IMPLANT
SPONGE SURGIFOAM ABS GEL 12-7 (HEMOSTASIS) IMPLANT
SPONGE SURGIFOAM ABS GEL SZ50 (HEMOSTASIS) ×4 IMPLANT
STAPLER SKIN PROX WIDE 3.9 (STAPLE) IMPLANT
STAPLER VISISTAT 35W (STAPLE) ×4 IMPLANT
STRIP CLOSURE SKIN 1/2X4 (GAUZE/BANDAGES/DRESSINGS) IMPLANT
SUT BONE WAX W31G (SUTURE) IMPLANT
SUT CHROMIC 3 0 PS 2 (SUTURE) ×4 IMPLANT
SUT CHROMIC 4 0 P 3 18 (SUTURE) ×4 IMPLANT
SUT CHROMIC 4 0 PS 2 18 (SUTURE) IMPLANT
SUT CHROMIC 5 0 P 3 (SUTURE) IMPLANT
SUT ETHILON 3 0 PS 1 (SUTURE) ×4 IMPLANT
SUT ETHILON 4 0 PS 2 18 (SUTURE) IMPLANT
SUT ETHILON 5 0 P 3 18 (SUTURE)
SUT ETHILON 6 0 P 1 (SUTURE) ×8 IMPLANT
SUT NYLON ETHILON 5-0 P-3 1X18 (SUTURE) IMPLANT
SUT PLAIN 4 0 ~~LOC~~ 1 (SUTURE) IMPLANT
SUT SILK 2 0 FS (SUTURE) IMPLANT
SUT VIC AB 3-0 SH 8-18 (SUTURE) ×4 IMPLANT
SUT VIC AB 4-0 P-3 18X BRD (SUTURE) IMPLANT
SUT VIC AB 4-0 P3 18 (SUTURE)
SYR 20ML ECCENTRIC (SYRINGE) ×4 IMPLANT
SYR BULB IRRIGATION 50ML (SYRINGE) ×8 IMPLANT
SYR CONTROL 10ML LL (SYRINGE) ×4 IMPLANT
TOWEL OR 17X24 6PK STRL BLUE (TOWEL DISPOSABLE) ×4 IMPLANT
TOWEL OR 17X26 10 PK STRL BLUE (TOWEL DISPOSABLE) ×8 IMPLANT
TRAY ENT MC OR (CUSTOM PROCEDURE TRAY) IMPLANT
TRAY FOLEY W/METER SILVER 14FR (SET/KITS/TRAYS/PACK) ×4 IMPLANT
WATER STERILE IRR 1000ML POUR (IV SOLUTION) ×4 IMPLANT

## 2015-03-30 NOTE — H&P (Signed)
  Gloria Adams is an 24 y.o. female.   Chief Complaint: Pituitary mass with visual loss HPI: The patient is a 24 year old female who was seen in the office in late July. She has noticed some visual changes recently when to her ophthalmologist.visual field testing which showed an abnormality. She is sent to a neurologist and eventually had an MRI scan of the brain which showed a large cystic abnormality within the pituitary with suprasellar extension. When seen in the office the patient denied any headache. She noticed no change in winter shoes size. She had a 81-year-old. Breast-feeding 11 months ago but is still had some milky discharge. She did however have a recent period. After evaluation in the office she underwent a large panel of pituitary testing which only showed some mildly elevated prolactin consistent with stalk effect. After discussing the options the patient requested surgery now comes for a trans-sphenoid all approach for resection of pituitary mass. I've had a long discussion with her regarding the risks and benefits of surgical intervention. The risks discussed include but are not limited to bleeding infection weakness numbness persistent difficulty with vision or worsening, pituitary insufficiency spinal fluid leakage tumor recurrence coma and death. We have discussed alternative methods of therapy along with the risks and benefits of nonintervention. She's had the opportunity to ask numerous questions and appears to understand. With this information in hand she has requested that we proceed with surgery.  Past Medical History  Diagnosis Date  . Medical history non-contributory   . Pregnancy induced hypertension     end of preg  . Bartholin cyst   . Multiple sclerosis     Past Surgical History  Procedure Laterality Date  . No past surgeries      NO PREVIOUS SURGERIES    Family History  Problem Relation Age of Onset  . Hypertension Father   . Diabetes Maternal Grandmother   .  Diabetes Paternal Grandmother    Social History:  reports that she has never smoked. She has never used smokeless tobacco. She reports that she drinks alcohol. She reports that she does not use illicit drugs.  Allergies: No Known Allergies  Medications Prior to Admission  Medication Sig Dispense Refill  . ibuprofen (ADVIL,MOTRIN) 200 MG tablet Take 600 mg by mouth every 6 (six) hours as needed for fever or headache.    . etonogestrel-ethinyl estradiol (NUVARING) 0.12-0.015 MG/24HR vaginal ring Insert vaginally and leave in place for 3 consecutive weeks, then remove for 1 week. (Patient not taking: Reported on 02/26/2015) 1 each 12    No results found for this or any previous visit (from the past 48 hour(s)). No results found.  A comprehensive review of systems was negative.  Blood pressure 121/71, pulse 73, temperature 97.3 F (36.3 C), temperature source Oral, resp. rate 18, weight 71.668 kg (158 lb), last menstrual period 03/01/2015, SpO2 100 %, not currently breastfeeding.  The patient is awake or and oriented. She has no facial asymmetry. She is a superior temporal quadrantanopsia. Other cranial nerve function is normal and her strength is intact. Assessment/Plan Impression is that of a cystic pituitary mass. The plan is for a transsphenoidal approach for resection.  Faythe Ghee, MD 03/30/2015, 7:28 AM

## 2015-03-30 NOTE — Brief Op Note (Signed)
03/30/2015  11:12 AM  PATIENT:  Gloria Adams  24 y.o. female  PRE-OPERATIVE DIAGNOSIS:  Pituitary Tumor  POST-OPERATIVE DIAGNOSIS:  pituitary tumor  PROCEDURE:  Procedure(s): CRANIOTOMY HYPOPHYSECTOMY TRANSNASAL APPROACH (N/A) TRANSNASAL APPROACH (N/A)  SURGEON:  Surgeon(s) and Role: Panel 1:    * Karie Chimera, MD - Primary    * Consuella Lose, MD  Panel 2:    * Rozetta Nunnery, MD - Primary  PHYSICIAN ASSISTANT:   ASSISTANTS: none   ANESTHESIA:   general  EBL:  Total I/O In: 1500 [I.V.:1500] Out: 61 [Urine:360; Blood:50]  BLOOD ADMINISTERED:none  DRAINS: none   LOCAL MEDICATIONS USED:  LIDOCAINE with EPI 9 cc  SPECIMEN:  Source of Specimen:  pituitary gland  DISPOSITION OF SPECIMEN:  PATHOLOGY  COUNTS:  YES  TOURNIQUET:  * No tourniquets in log *  DICTATION: .Other Dictation: Dictation Number [id number]  PLAN OF CARE: Admit to inpatient   PATIENT DISPOSITION:  PACU - hemodynamically stable.   Delay start of Pharmacological VTE agent (>24hrs) due to surgical blood loss or risk of bleeding: yes

## 2015-03-30 NOTE — Progress Notes (Signed)
Pt arrived to pacu from OR on bed. Attached to monitors, oxygen.  Neuro assessment performed.  Pt. Denies double vision but does have blurred vision.  She is not wearing her contacts and says her vision is blurry when she is not wearing her contacts.

## 2015-03-30 NOTE — Interval H&P Note (Signed)
History and Physical Interval Note:  03/30/2015 7:30 AM  Gloria Adams  has presented today for surgery, with the diagnosis of Pituitary Tumor  The various methods of treatment have been discussed with the patient and family. After consideration of risks, benefits and other options for treatment, the patient has consented to  Procedure(s): CRANIOTOMY HYPOPHYSECTOMY TRANSNASAL APPROACH (N/A) TRANSNASAL APPROACH (N/A) as a surgical intervention .  The patient's history has been reviewed, patient examined, no change in status, stable for surgery.  I have reviewed the patient's chart and labs.  Questions were answered to the patient's satisfaction.     NEWMAN, CHRISTOPHER

## 2015-03-30 NOTE — Transfer of Care (Signed)
Immediate Anesthesia Transfer of Care Note  Patient: Gloria Adams  Procedure(s) Performed: Procedure(s): CRANIOTOMY HYPOPHYSECTOMY TRANSNASAL APPROACH (N/A) TRANSNASAL APPROACH (N/A)  Patient Location: PACU  Anesthesia Type:General  Level of Consciousness: awake and patient cooperative  Airway & Oxygen Therapy: Patient Spontanous Breathing and Patient connected to face mask  Post-op Assessment: Report given to RN, Post -op Vital signs reviewed and stable and Patient moving all extremities X 4  Post vital signs: Reviewed and stable  Last Vitals:  Filed Vitals:   03/30/15 0609  BP: 121/71  Pulse: 73  Temp: 36.3 C  Resp: 18    Complications: No apparent anesthesia complications

## 2015-03-30 NOTE — Progress Notes (Signed)
POST OP Check Awake alert and oriented Minimal bleeding from nose Stable post op check Will plan on removing nasal packing in 2-3 days

## 2015-03-30 NOTE — Op Note (Signed)
Preop diagnosis: Cystic pituitary mass Postop diagnosis: Same Procedure: Transsphenoidal approach for removal of pituitary cyst Surgeon: Mardell Suttles Co-surgeon: Newman Asst.: Nundkumar  After being placed in 3 point pin fixation in the semisitting position with the head turned towards the right shoulder the patient's nasal region and abdomen were prepped and draped in the usual sterile fashion. While Dr. Lucia Gaskins performed the transsphenoidal approach I made a small incision in the right abdomen and took a fat graft. We then irrigated that area and closed it up with inverted 30 Vicryls on the subcutaneous cuticular layer and staples on the skin. When Dr. Lucia Gaskins had made the approach to the posterior wall of the sphenoid sinus and placed his self-retaining retractor I assumed role of surgeon. I exposed the sellar wall and used a small osteotome to make 2 small openings. I then used a Kerrison punch to enlarge the opening until the dura of the pituitary region was well visualized. I coagulated on the dura and then incised in a cruciate fashion. We immediately encountered clear cystic fluid which left a cavity within the sella. We used a variety of small curettes to inspected all directions to see if there was any tumor tissue. A few small samples were obtained and sent to see whether this was cystic wall or some macroadenoma tissue. At this point we irrigated copiously controlled any bleeding with Gelfoam. We fashioned a new sellar floor with a piece of nasal cartilage and then put fat within the sphenoid sinus. Dr. Lucia Gaskins then assumed control of the case and closed. At the end of the case the patient was extubated and taken to recovery room in stable condition.

## 2015-03-30 NOTE — Anesthesia Procedure Notes (Signed)
Procedure Name: Intubation Date/Time: 03/30/2015 7:42 AM Performed by: Willeen Cass P Pre-anesthesia Checklist: Patient identified, Emergency Drugs available, Suction available, Timeout performed and Patient being monitored Patient Re-evaluated:Patient Re-evaluated prior to inductionOxygen Delivery Method: Circle system utilized Preoxygenation: Pre-oxygenation with 100% oxygen Intubation Type: IV induction Ventilation: Mask ventilation without difficulty Laryngoscope Size: Mac and 3 Grade View: Grade I Tube type: Oral Tube size: 7.0 mm Number of attempts: 1 Airway Equipment and Method: Stylet Placement Confirmation: ETT inserted through vocal cords under direct vision,  positive ETCO2 and breath sounds checked- equal and bilateral Secured at: 23 cm Tube secured with: Tape Dental Injury: Teeth and Oropharynx as per pre-operative assessment

## 2015-03-30 NOTE — Progress Notes (Signed)
Dr. Kathyrn Sheriff notified of increased urine output for 3 hours and specific gravity results. Orders received for serum sodium to be drawn, and for ddavp. Will monitor.

## 2015-03-30 NOTE — Anesthesia Postprocedure Evaluation (Signed)
Anesthesia Post Note  Patient: Gloria Adams  Procedure(s) Performed: Procedure(s) (LRB): CRANIOTOMY HYPOPHYSECTOMY TRANSNASAL APPROACH (N/A) TRANSNASAL APPROACH (N/A)  Anesthesia type: general  Patient location: PACU  Post pain: Pain level controlled  Post assessment: Patient's Cardiovascular Status Stable  Last Vitals:  Filed Vitals:   03/30/15 1200  BP: 111/86  Pulse: 64  Temp:   Resp: 14    Post vital signs: Reviewed and stable  Level of consciousness: sedated  Complications: No apparent anesthesia complications

## 2015-03-30 NOTE — Anesthesia Preprocedure Evaluation (Addendum)
Anesthesia Evaluation  Patient identified by MRN, date of birth, ID band Patient awake    Reviewed: Allergy & Precautions, NPO status , Patient's Chart, lab work & pertinent test results  Airway Mallampati: II  TM Distance: >3 FB Neck ROM: Full    Dental  (+) Teeth Intact   Pulmonary neg pulmonary ROS,    Pulmonary exam normal       Cardiovascular hypertension, negative cardio ROS Normal cardiovascular exam    Neuro/Psych negative psych ROS   GI/Hepatic negative GI ROS, Neg liver ROS,   Endo/Other  Pituitary Mass  Renal/GU negative Renal ROS  negative genitourinary   Musculoskeletal negative musculoskeletal ROS (+)   Abdominal   Peds negative pediatric ROS (+)  Hematology negative hematology ROS (+)   Anesthesia Other Findings   Reproductive/Obstetrics negative OB ROS                            Anesthesia Physical Anesthesia Plan  ASA: III  Anesthesia Plan: General   Post-op Pain Management:    Induction: Intravenous  Airway Management Planned: Oral ETT  Additional Equipment: Arterial line  Intra-op Plan:   Post-operative Plan: Extubation in OR  Informed Consent: I have reviewed the patients History and Physical, chart, labs and discussed the procedure including the risks, benefits and alternatives for the proposed anesthesia with the patient or authorized representative who has indicated his/her understanding and acceptance.   Dental advisory given  Plan Discussed with: CRNA, Anesthesiologist and Surgeon  Anesthesia Plan Comments:         Anesthesia Quick Evaluation

## 2015-03-31 ENCOUNTER — Encounter (HOSPITAL_COMMUNITY): Payer: Self-pay | Admitting: Neurosurgery

## 2015-03-31 LAB — BASIC METABOLIC PANEL
ANION GAP: 8 (ref 5–15)
Anion gap: 9 (ref 5–15)
BUN: <5 mg/dL — ABNORMAL LOW (ref 6–20)
CALCIUM: 9.2 mg/dL (ref 8.9–10.3)
CALCIUM: 9.8 mg/dL (ref 8.9–10.3)
CHLORIDE: 115 mmol/L — AB (ref 101–111)
CO2: 23 mmol/L (ref 22–32)
CO2: 22 mmol/L (ref 22–32)
CREATININE: 0.63 mg/dL (ref 0.44–1.00)
CREATININE: 0.68 mg/dL (ref 0.44–1.00)
Chloride: 108 mmol/L (ref 101–111)
GFR calc Af Amer: >60 mL/min (ref >60)
GLUCOSE: 152 mg/dL — AB (ref 65–99)
Glucose, Bld: 214 mg/dL — ABNORMAL HIGH (ref 65–99)
Potassium: 3.9 mmol/L (ref 3.5–5.1)
Potassium: 4.5 mmol/L (ref 3.5–5.1)
SODIUM: 146 mmol/L — AB (ref 135–145)
Sodium: 139 mmol/L (ref 135–145)

## 2015-03-31 MED ORDER — DESMOPRESSIN ACETATE 4 MCG/ML IJ SOLN
1.0000 ug | Freq: Once | INTRAMUSCULAR | Status: AC
  Administered 2015-03-31: 1 ug via INTRAVENOUS
  Filled 2015-03-31: qty 0.25

## 2015-03-31 NOTE — Op Note (Signed)
NAMELAYIA, WALLA NO.:  [id number]  MEDICAL RECORD NO.:  [id number]  LOCATION:  3M07C                        FACILITY:  [facility]  PHYSICIAN:  Leonides Sake. Lucia Gaskins, M.D.DATE OF BIRTH:  04-Dec-1990  DATE OF PROCEDURE:  03/30/2015 DATE OF DISCHARGE:                              OPERATIVE REPORT   PREOPERATIVE DIAGNOSIS:  Pituitary tumor/cyst.  POSTOPERATIVE DIAGNOSIS:  Pituitary tumor/cyst.  OPERATION PERFORMED:  Transseptal transsphenoidal resection of pituitary tumor mass/cyst.  SURGEON:  Leonides Sake. Lucia Gaskins, M.D.  Lolly MustacheFaythe Ghee, M.D.  ANESTHESIA:  General endotracheal.  COMPLICATIONS:  None.  BRIEF CLINICAL NOTE:  Gloria Adams is a 24 year old female, who has had  some visual changes in her peripheral days laterally.  She underwent a workup, which included MRI scan that showed a pituitary mass or cyst extending superiorly up between the optic chiasm.  She was taken to the operating room at this time for excision of pituitary mass/cyst.  DESCRIPTION OF PROCEDURE:  After adequate endotracheal anesthesia, the patient was positioned by Dr. Hal Neer.  The nose was then prepped by myself, Dr. Lucia Gaskins with Afrin pledgets and Betadine in septum and floor of nose was injected with Xylocaine with epinephrine for hemostasis. Next, an extended hemitransfixion incision was made along the cartilaginous septum on the right side.  Mucoperichondrial and mucoperiosteal flaps were elevated along the right side of the septum down to the floor of the nose.  In addition, the anterior 2 to 2.5 cm of cartilage septum were preserved and the cartilage septum was dissected off the maxillary crest and tapped vertically posterior to this and the anterior cartilaginous septum was transposed into the left airway off the maxillary crest.  Then mucoperiosteum was elevated off the vomer posteriorly back to the space of the sphenoid sinus.  By using the pituitary  retractors these were positioned and then using a 6 mm osteotome,  the space of the sphenoid sinus was opened up and was enlarged with up and down biting Kerrison forceps.  The sphenoid septum was removed back to the space of the sella.  The patient had a very small nose and mucosa but little bit limited superior exposure of the sella.  It was then decided to make a relaxing incision in the nostril to allow a more superior exposure of the sphenoid sinus and sella.  The pituitary retractor was removed.  A vertical incision was made down towards the nostril to open up the nostril a little bit wider and then the pituitary retractor was then repositioned and had a little bit better exposure of the superior aspect of the sella in the sphenoid sinus.  Dr. Hal Neer then scrubbed in and used Kerrison forceps to enlarge the sphenoidotomy a little bit larger superiorly, and then he opened up the sella and entered a large cyst within the pituitary gland in addition to removing some pituitary tissue within the cyst cavity. This was sent for frozen and revealed an adenoma.  After removal of the portion of the gland tissue as well as the large cyst and the cyst wall, Dr. Hal Neer closed the opening of the sella with a piece of cartilage and bone.  The sphenoid sinus was  packed with previously harvested abdominal fat by Dr. Hal Neer, and then I was called back in to close the remaining nasal approach.  The incision to open up the nostril inferiorly was closed with a 6-0 nylon suture in addition to the 5-0 chromic sutures.  The  hemitransfixion incision was closed with 4-0 chromic  sutures.  The septum was basted with a 3-0 chromic suture. Doyle splints were secured to either side of the septum with a 3-0 nylon suture and the nose was packed with elongated Telfa packs placed on either side of the nose and soaked in bacitracin ointment.  This completed the procedure.  The patient was awoke from anesthesia  and transferred to the recovery room postop and doing well.  She will be admitted to neuro ICU for overnight observation.  We will plan on removing nasal packs in 3 days.  Even plan on removing the splints in 1 week.          ______________________________ Leonides Sake. Lucia Gaskins, M.D.     CEN/MEDQ  D:  03/30/2015  T:  03/31/2015  Job:  [id number]

## 2015-03-31 NOTE — Progress Notes (Signed)
Dr. De Blanch office notified of pt i&O [phone] in and [phone] out) and urine SG of 1.001. Spoke with [employer].

## 2015-03-31 NOTE — Progress Notes (Signed)
Discussed BMP results and urine with Dr. Hal Neer. Orders for DDAVP received.

## 2015-03-31 NOTE — Progress Notes (Signed)
Utilization review completed. Robin Pafford, RN, BSN. 

## 2015-03-31 NOTE — Clinical Documentation Improvement (Signed)
Neuro Surgery  Abnormal Lab/Test Results:  Na+ 133  Please provide a diagnosis associated with abnormal lab value and document findings in next progress note and include in discharge summary if applicable.   Other Condition  Cannot Clinically Determine  Supporting Information:  dddavp given  specific gravity was 1.002  monitor  repeat draw of Na+ was 141  Treatment Provided:  Infusion of 0.9% NaCl hung  Please exercise your independent, professional judgment when responding. A specific answer is not anticipated or expected.  Thank You,  [place] Brewster [phone]

## 2015-03-31 NOTE — Progress Notes (Signed)
Patient ID: Gloria Adams, female   DOB: 08/04/1991, 24 y.o.   MRN: [id number] Subjective: Patient reports improved vision  Objective: Vital signs in last 24 hours: Temp:  [97 F (36.1 C)-98.9 F (37.2 C)] 98.2 F (36.8 C) (08/24 0751) Pulse Rate:  [57-97] 62 (08/24 0800) Resp:  [10-21] 17 (08/24 0800) BP: (95-124)/(56-86) 95/59 mmHg (08/24 0800) SpO2:  [95 %-100 %] 96 % (08/24 0800) Arterial Line BP: (111-171)/(61-91) 126/61 mmHg (08/24 0800) Weight:  [73.3 kg (161 lb 9.6 oz)] 73.3 kg (161 lb 9.6 oz) (08/23 1235)  Intake/Output from previous day: 08/23 0701 - 08/24 0700 In: 4963.8 [P.O.:1700; I.V.:2953.8; IV Piggyback:310] Out: 5310 [Urine:5260; Blood:50] Intake/Output this shift: Total I/O In: 75 [I.V.:75] Out: -   improved visual fields, right more than left  Lab Results:  Recent Labs  03/30/15 1441  WBC 12.8*  HGB 12.3  HCT 36.8  PLT 296   BMET  Recent Labs  03/30/15 1441 03/30/15 2330 03/31/15 0700  NA 133* 141 139  K 4.0  --  3.9  CL 105  --  108  CO2 21*  --  23  GLUCOSE 124*  --  152*  BUN 5*  --  <5*  CREATININE 0.50  --  0.63  CALCIUM 8.5*  --  9.2    Studies/Results: No results found.  Assessment/Plan: Doing well. Given 1 dose of dddavp for sg of 1.002. NA 141. Will watch for now, and determine need for additional doses.   LOS: 1 day  as above   Faythe Ghee, MD 03/31/2015, 9:12 AM

## 2015-04-01 DIAGNOSIS — D352 Benign neoplasm of pituitary gland: Principal | ICD-10-CM

## 2015-04-01 DIAGNOSIS — E232 Diabetes insipidus: Secondary | ICD-10-CM

## 2015-04-01 LAB — BASIC METABOLIC PANEL
Anion gap: 9 (ref 5–15)
Anion gap: 8 (ref 5–15)
CHLORIDE: 109 mmol/L (ref 101–111)
CHLORIDE: 109 mmol/L (ref 101–111)
CO2: 24 mmol/L (ref 22–32)
CO2: 26 mmol/L (ref 22–32)
Calcium: 8.9 mg/dL (ref 8.9–10.3)
Calcium: 8.8 mg/dL — ABNORMAL LOW (ref 8.9–10.3)
Creatinine, Ser: 0.59 mg/dL (ref 0.44–1.00)
Creatinine, Ser: 0.72 mg/dL (ref 0.44–1.00)
GFR calc Af Amer: >60 mL/min (ref >60)
GFR calc Af Amer: >60 mL/min (ref >60)
GFR calc non Af Amer: >60 mL/min (ref >60)
GFR calc non Af Amer: >60 mL/min (ref >60)
GLUCOSE: 148 mg/dL — AB (ref 65–99)
Glucose, Bld: 150 mg/dL — ABNORMAL HIGH (ref 65–99)
POTASSIUM: 4.1 mmol/L (ref 3.5–5.1)
POTASSIUM: 3.6 mmol/L (ref 3.5–5.1)
SODIUM: 142 mmol/L (ref 135–145)
SODIUM: 143 mmol/L (ref 135–145)

## 2015-04-01 MED ORDER — BACITRACIN ZINC 500 UNIT/GM EX OINT
TOPICAL_OINTMENT | CUTANEOUS | Status: DC | PRN
  Filled 2015-04-01: qty 28.35

## 2015-04-01 MED ORDER — DEXAMETHASONE 4 MG PO TABS
4.0000 mg | ORAL_TABLET | Freq: Three times a day (TID) | ORAL | Status: DC
  Administered 2015-04-01 – 2015-04-02 (×4): 4 mg via ORAL
  Filled 2015-04-01 (×5): qty 1

## 2015-04-01 MED ORDER — DESMOPRESSIN ACETATE 0.1 MG PO TABS
0.1000 mg | ORAL_TABLET | Freq: Three times a day (TID) | ORAL | Status: DC
  Administered 2015-04-01 – 2015-04-02 (×3): 0.1 mg via ORAL
  Filled 2015-04-01 (×5): qty 1

## 2015-04-01 MED ORDER — KCL IN DEXTROSE-NACL 20-5-0.45 MEQ/L-%-% IV SOLN
INTRAVENOUS | Status: DC
Start: 2015-04-01 — End: 2015-04-02
  Administered 2015-04-01 – 2015-04-02 (×2): via INTRAVENOUS
  Filled 2015-04-01 (×4): qty 1000

## 2015-04-01 MED ORDER — PANTOPRAZOLE SODIUM 40 MG PO TBEC
40.0000 mg | DELAYED_RELEASE_TABLET | Freq: Every day | ORAL | Status: DC
  Administered 2015-04-01: 40 mg via ORAL
  Filled 2015-04-01: qty 1

## 2015-04-01 NOTE — Clinical Documentation Improvement (Signed)
Hospitalist  Please clarify if the following diagnosis, Sepsis was:   Present at the time of admission (POA)  NOT present at the time of admission and it developed during the inpatient stay  Unable to clinically determine whether the condition was present on admission.  Unknown  Please exercise your independent, professional judgment when responding. A specific answer is not anticipated or expected.  Thank You,  [place]

## 2015-04-01 NOTE — Progress Notes (Signed)
POD 2 AF VSS Minimal bleeding from nose. Nasal packing removed Will have patient follow up in my office on Mon to have septal splints and suture removed Stable post op course

## 2015-04-01 NOTE — Consult Note (Signed)
Reason for Consult: Probable diabetes insipidus  Referring Physician: Lindell Spar is an 24 y.o. female.   HPI: Patient had surgery for a sellar and suprasellar cystic lesion on 03/30/15 Since the evening of the surgery the patient has had excessive urination with output being over 4 L in the 12 hours after 7 PM and continuing yesterday Because of her excessive urine output she was given a dose of IV DDAVP, 1 g with which her urination improved Also has specific gravity has been close to 1.00 prior to the DDAVP Her urine specific gravity also improved up to 1.02 with this  Since this morning her urine output has progressively increased and she is also complaining of increased thirst Prior to her admission she does not think she was having excessive urination and usually not getting up at night although she thinks she tends to drink a lot of water anyway during the day  Her electrolytes indicate some excessive water loss with the sodium going up to 146 prior to her dose of DDAVP but has been back to normal today, as low as 139 Also her blood pressure has been fluctuating and has been as low as 95 systolic Currently the patient's IV has been discontinued, previously getting 75 mL an hour  The patient's fluid balance has been negative -1700 from the last 12 hour and has been overall somewhat negative as follows:  I/O last 3 completed shifts: In: 09470 [P.O.:8916; I.V.:2055; IV Piggyback:210] Out: 96283 [MOQHU:76546; Stool:2]     PITUITARY lesion:  She apparently has had endocrinology labs evaluated in detail by the neurosurgeon but no reports are available currently Her prolactin was reportedly only slightly increased She said that she breast-fed for about a month after she delivered 11 months ago and has only  small amount of milk discharge at this time   She did not have any significant headaches prior to her surgery and none today  She reports no previous history of  weakness, lightheadedness, fatigue.  She has no history of cord intolerance although she feels cold in the hospital No history of significant weight change, change in ring or shoe size No history of bruising   Past Medical History  Diagnosis Date  . Medical history non-contributory   . Pregnancy induced hypertension     end of preg  . Bartholin cyst   . Multiple sclerosis     Past Surgical History  Procedure Laterality Date  . No past surgeries      NO PREVIOUS SURGERIES  . Craniotomy N/A 03/30/2015    Procedure: CRANIOTOMY HYPOPHYSECTOMY TRANSNASAL APPROACH;  Surgeon: Karie Chimera, MD;  Location: [facility];  Service: Neurosurgery;  Laterality: N/A;  . Transnasal approach N/A 03/30/2015    Procedure: TRANSNASAL APPROACH;  Surgeon: Rozetta Nunnery, MD;  Location: [facility];  Service: ENT;  Laterality: N/A;    Family History  Problem Relation Age of Onset  . Hypertension Father   . Diabetes Maternal Grandmother   . Diabetes Paternal Grandmother     Social History:  reports that she has never smoked. She has never used smokeless tobacco. She reports that she drinks alcohol. She reports that she does not use illicit drugs.  Allergies: No Known Allergies  Medications: medication reviewed Scheduled: . desmopressin  0.1 mg Oral TID  . dexamethasone  4 mg Oral TID  . docusate sodium  100 mg Oral BID  . levETIRAcetam  500 mg Intravenous BID  . pantoprazole  40  mg Oral QHS    Results for orders placed or performed during the hospital encounter of 03/30/15 (from the past 48 hour(s))  Sodium     Status: None   Collection Time: 03/30/15 11:30 PM  Result Value Ref Range   Sodium 141 135 - 145 mmol/L    Comment: DELTA CHECK NOTED  Basic metabolic panel     Status: Abnormal   Collection Time: 03/31/15  7:00 AM  Result Value Ref Range   Sodium 139 135 - 145 mmol/L   Potassium 3.9 3.5 - 5.1 mmol/L   Chloride 108 101 - 111 mmol/L   CO2 23 22 - 32 mmol/L   Glucose, Bld  152 (H) 65 - 99 mg/dL   BUN <5 (L) 6 - 20 mg/dL   Creatinine, Ser 0.63 0.44 - 1.00 mg/dL   Calcium 9.2 8.9 - 10.3 mg/dL   GFR calc non Af Amer >60 >60 mL/min   GFR calc Af Amer >60 >60 mL/min    Comment: (NOTE) The eGFR has been calculated using the CKD EPI equation. This calculation has not been validated in all clinical situations. eGFR's persistently <60 mL/min signify possible Chronic Kidney Disease.    Anion gap 8 5 - 15  Basic metabolic panel     Status: Abnormal   Collection Time: 03/31/15  5:28 PM  Result Value Ref Range   Sodium 146 (H) 135 - 145 mmol/L    Comment: DELTA CHECK NOTED   Potassium 4.5 3.5 - 5.1 mmol/L   Chloride 115 (H) 101 - 111 mmol/L   CO2 22 22 - 32 mmol/L   Glucose, Bld 214 (H) 65 - 99 mg/dL   BUN <5 (L) 6 - 20 mg/dL   Creatinine, Ser 0.68 0.44 - 1.00 mg/dL   Calcium 9.8 8.9 - 10.3 mg/dL   GFR calc non Af Amer >60 >60 mL/min   GFR calc Af Amer >60 >60 mL/min    Comment: (NOTE) The eGFR has been calculated using the CKD EPI equation. This calculation has not been validated in all clinical situations. eGFR's persistently <60 mL/min signify possible Chronic Kidney Disease.    Anion gap 9 5 - 15  Basic metabolic panel     Status: Abnormal   Collection Time: 04/01/15  6:00 AM  Result Value Ref Range   Sodium 142 135 - 145 mmol/L   Potassium 4.1 3.5 - 5.1 mmol/L   Chloride 109 101 - 111 mmol/L   CO2 24 22 - 32 mmol/L   Glucose, Bld 148 (H) 65 - 99 mg/dL   BUN <5 (L) 6 - 20 mg/dL   Creatinine, Ser 0.59 0.44 - 1.00 mg/dL   Calcium 8.9 8.9 - 10.3 mg/dL   GFR calc non Af Amer >60 >60 mL/min   GFR calc Af Amer >60 >60 mL/min    Comment: (NOTE) The eGFR has been calculated using the CKD EPI equation. This calculation has not been validated in all clinical situations. eGFR's persistently <60 mL/min signify possible Chronic Kidney Disease.    Anion gap 9 5 - 15  Basic metabolic panel     Status: Abnormal   Collection Time: 04/01/15  6:28 PM   Result Value Ref Range   Sodium 143 135 - 145 mmol/L   Potassium 3.6 3.5 - 5.1 mmol/L   Chloride 109 101 - 111 mmol/L   CO2 26 22 - 32 mmol/L   Glucose, Bld 150 (H) 65 - 99 mg/dL   BUN <5 (L) 6 -  20 mg/dL   Creatinine, Ser 0.72 0.44 - 1.00 mg/dL   Calcium 8.8 (L) 8.9 - 10.3 mg/dL   GFR calc non Af Amer >60 >60 mL/min   GFR calc Af Amer >60 >60 mL/min    Comment: (NOTE) The eGFR has been calculated using the CKD EPI equation. This calculation has not been validated in all clinical situations. eGFR's persistently <60 mL/min signify possible Chronic Kidney Disease.    Anion gap 8 5 - 15    No results found.  Review of Systems  Constitutional: Negative for weight loss.  Eyes: Positive for blurred vision.       Patient had presented to the optician with history of blurred vision and subsequently was seen by neurologist who found her pituitary lesion on MRI scan.  She says her eyesight is better on the right side today but still slightly blurry on the left  Respiratory: Negative for shortness of breath.   Cardiovascular: Negative for leg swelling.  Gastrointestinal: Negative for diarrhea.  Genitourinary: Positive for frequency.  Musculoskeletal: Negative for joint pain.  Neurological: Negative for sensory change and headaches.  Endo/Heme/Allergies: Positive for polydipsia.   Blood pressure 105/55, pulse 71, temperature 98 F (36.7 C), temperature source Oral, resp. rate 18, height _0  (1.575 m), weight 161 lb 9.6 oz (73.3 kg), last menstrual period 03/01/2015, SpO2 98 %, not currently breastfeeding. Physical Exam  Constitutional: She appears well-developed and well-nourished.  HENT:  Mild dryness of oral mucosa  Neck: No thyromegaly present.  Cardiovascular: Normal rate, regular rhythm and normal heart sounds.   Respiratory: Breath sounds normal. No respiratory distress. She has no wheezes.  GI: She exhibits no distension and no mass.  Musculoskeletal: She exhibits no edema.   Lymphadenopathy:    She has no cervical adenopathy.  Neurological: She displays normal reflexes.  Skin: Skin is warm and dry. She is not diaphoretic.    Assessment/Plan:   Postoperative diabetes insipidus, with significant increase in urine output of 6.5 L in the 12 hours prior to her getting DDAVP last evening and very low specific gravity, minimal hypernatremia with sodium 146  She responded well to DDAVP but today she appears to have progressive increase in urine output which is getting more dilute also  Since her diabetes insipidus appears to be persisting will start her oral DDAVP 0.37m every 8 hours She will be monitored overnight and will continue to measure her specific gravity 4 times a day Electrolytes will be rechecked in the morning Since she is relatively volume depleted over the last 8 hours will increase her IV rate to 100 mL until midnight tonight; she is currently drinking significant amount of water also  Patient will also need to be followed up as an outpatient for further management and adjustment for medication as needed  Currently the patient is on dexamethasone and will need to review her preoperative endocrine labs to consider whether she had any hormonal deficits; by history she does not appear to have had any hypopituitarism  Thank you for the consultation  [facility]8/25/2016, 8:07 PM

## 2015-04-01 NOTE — Progress Notes (Signed)
Patient ID: Gloria Adams, female   DOB: 04/23/1991, 24 y.o.   MRN: [id number] Afeb, vss 1 episode of increased urine out put yesterday that required DDAVP; Will get endocrine to see Will keep foley in and monitor in ICU for now until fluid balance is established. Her Na this morning was 142.  Otherwise she looks excellent.

## 2015-04-01 NOTE — Discharge Instructions (Signed)
Return to see Dr Lucia Gaskins on Monday (04/05/15) at 4:15 to have septal splints removed.

## 2015-04-02 LAB — BASIC METABOLIC PANEL
Anion gap: 7 (ref 5–15)
CALCIUM: 8.6 mg/dL — AB (ref 8.9–10.3)
CO2: 29 mmol/L (ref 22–32)
CREATININE: 0.65 mg/dL (ref 0.44–1.00)
Chloride: 102 mmol/L (ref 101–111)
GFR calc Af Amer: >60 mL/min (ref >60)
GLUCOSE: 116 mg/dL — AB (ref 65–99)
Potassium: 4 mmol/L (ref 3.5–5.1)
SODIUM: 138 mmol/L (ref 135–145)

## 2015-04-02 MED ORDER — LEVETIRACETAM 500 MG PO TABS
500.0000 mg | ORAL_TABLET | Freq: Two times a day (BID) | ORAL | Status: DC
  Administered 2015-04-02: 500 mg via ORAL
  Filled 2015-04-02: qty 1

## 2015-04-02 MED ORDER — DESMOPRESSIN ACETATE 0.1 MG PO TABS: 0.1000 mg | ORAL_TABLET | Freq: Two times a day (BID) | ORAL | Status: DC

## 2015-04-02 MED ORDER — DEXAMETHASONE 4 MG PO TABS: 4.0000 mg | ORAL_TABLET | Freq: Three times a day (TID) | ORAL | Status: DC

## 2015-04-02 MED ORDER — DESMOPRESSIN ACETATE 0.1 MG PO TABS: 0.1000 mg | ORAL_TABLET | Freq: Three times a day (TID) | ORAL | Status: DC

## 2015-04-02 MED ORDER — DESMOPRESSIN ACETATE 0.1 MG PO TABS
0.1000 mg | ORAL_TABLET | Freq: Two times a day (BID) | ORAL | Status: DC
  Filled 2015-04-02: qty 1

## 2015-04-02 MED ORDER — HYDROCODONE-ACETAMINOPHEN 5-325 MG PO TABS: 1.0000 | ORAL_TABLET | ORAL | Status: DC | PRN

## 2015-04-02 MED ORDER — DOCUSATE SODIUM 100 MG PO CAPS: 100.0000 mg | ORAL_CAPSULE | Freq: Two times a day (BID) | ORAL | Status: DC

## 2015-04-02 MED ORDER — DESMOPRESSIN ACETATE 0.1 MG PO TABS
0.1000 mg | ORAL_TABLET | Freq: Three times a day (TID) | ORAL | Status: DC
  Administered 2015-04-02: 0.1 mg via ORAL
  Filled 2015-04-02 (×2): qty 1

## 2015-04-02 NOTE — Care Management Note (Signed)
Case Management Note  Patient Details  Name: Nikki Glanzer MRN: [id number] Date of Birth: 28-Oct-1990  Subjective/Objective:     Pt s/p transeptal resection of pituitary tumor on 03/30/15.  PTA, pt independent of ADLS.               Action/Plan: Pt for dc later today.  No dc needs identified.    Expected Discharge Date:     04/02/15             Expected Discharge Plan:  Home/Self Care  In-House Referral:     Discharge planning Services  CM Consult  Post Acute Care Choice:    Choice offered to:     DME Arranged:    DME Agency:     HH Arranged:    HH Agency:     Status of Service:  Completed, signed off  Medicare Important Message Given:    Date Medicare IM Given:    Medicare IM give by:    Date Additional Medicare IM Given:    Additional Medicare Important Message give by:     If discussed at [place] of Stay Meetings, dates discussed:    Additional Comments:  Ella Bodo, RN 04/02/2015, 3:19 PM

## 2015-04-02 NOTE — Progress Notes (Signed)
Subjective:  Follow-up of diabetes insipidus  With starting DDAVP 0.1 mg 3 times a day her urine output has improved significantly She had 700 mL out last night which is significantly less than previous day output of 5.7 L. She is still thirsty but not as much Otherwise feels fairly good and appears to be ready for discharge  Her sodium is improved further at 138   Objective: Vital signs in last 24 hours: Temp:  [97.7 F (36.5 C)-98.8 F (37.1 C)] 97.7 F (36.5 C) (08/26 0758) Pulse Rate:  [53-97] 53 (08/26 0800) Resp:  [14-22] 20 (08/26 0800) BP: (97-134)/(53-79) 103/69 mmHg (08/26 0800) SpO2:  [97 %-100 %] 97 % (08/26 0800) Arterial Line BP: (165)/(80) 165/80 mmHg (08/25 1115) Weight change:  Last BM Date: 03/31/15  Intake/Output from previous day: 08/25 0701 - 08/26 0700 In: 3570 [P.O.:2290; I.V.:1175; IV Piggyback:105] Out: 5735 [Urine:5735] Intake/Output this shift: Total I/O In: 30 [I.V.:30] Out: 700 [Urine:700]  EXAM:  Mild dryness of mucous membranes She is alert  Lab Results:  Recent Labs  03/30/15 1441  WBC 12.8*  HGB 12.3  HCT 36.8  PLT 296   BMET  Recent Labs  04/01/15 1828 04/02/15 0704  NA 143 138  K 3.6 4.0  CL 109 102  CO2 26 29  GLUCOSE 150* 116*  BUN <5* <5*  CREATININE 0.72 0.65  CALCIUM 8.8* 8.6*    Studies/Results: No results found.  Medications:  Scheduled: . desmopressin  0.1 mg Oral BID  . dexamethasone  4 mg Oral TID  . docusate sodium  100 mg Oral BID  . levETIRAcetam  500 mg Oral BID  . pantoprazole  40 mg Oral QHS    Assessment/Plan: Since her urine output has improved significantly and her sodium is below 140 will reduce her DDAVP to twice a day She will need to be followed up in another week in the office for continued monitoring Not clear if she will need DDAVP long-term as yet  Apparently her previous endocrine labs have been normal although these have not been available for review Will do follow-up  labs when she comes back to the office also   LOS: 3 days   Tiphanie Vo 04/02/2015, 10:06 AM

## 2015-04-02 NOTE — Progress Notes (Signed)
Discharge instructions and medication instructions given to patient with mother present.  No questions or concerns voiced.  Patient discharged home with mother.

## 2015-04-02 NOTE — Discharge Summary (Signed)
Physician Discharge Summary  Patient ID: Gloria Adams MRN: [id number] DOB/AGE: 22-12-1990 22 y.o.  Admit date: 03/30/2015 Discharge date: 04/02/2015  Admission Diagnoses: Pituitary macroadenoma  Discharge Diagnoses: Pituitary macroadenoma with diabetes insipidus Active Problems:   Pituitary macroadenoma with extrasellar extension   Discharged Condition: good  Hospital Course: Dr. Hal Neer and Lucia Gaskins performed a transsphenoidal resection of pituitary macroadenoma on 03/30/2015. The patient's postoperative course was remarkable for diabetes insipidus. She was seen by Dr. Dwyane Dee, endocrinology, and treated with DDAVP and Decadron. Arrangements were made for outpatient follow-up.  On 04/02/2015 the patient requested discharge to home. She was given oral and written discharge instructions. All her questions were answered.  Consults: Endocrinology Significant Diagnostic Studies: None Treatments: Transferred all resection of pituitary macroadenoma Discharge Exam: Blood pressure 106/60, pulse 86, temperature 97.6 F (36.4 C), temperature source Oral, resp. rate 14, height 5\' 2"  (1.575 m), weight 73.3 kg (161 lb 9.6 oz), last menstrual period 03/01/2015, SpO2 100 %, not currently breastfeeding. The patient is alert and pleasant. She is oriented 3. Her speech is normal. Her strength is normal. Her vision has improved in her right eye. She has decreased vision in her left eye unchanged from her preoperative status  Disposition: Home  Discharge Instructions    Call MD for:  difficulty breathing, headache or visual disturbances    Complete by:  As directed      Call MD for:  extreme fatigue    Complete by:  As directed      Call MD for:  hives    Complete by:  As directed      Call MD for:  persistant dizziness or light-headedness    Complete by:  As directed      Call MD for:  persistant nausea and vomiting    Complete by:  As directed      Call MD for:  redness, tenderness, or signs of  infection (pain, swelling, redness, odor or green/yellow discharge around incision site)    Complete by:  As directed      Call MD for:  severe uncontrolled pain    Complete by:  As directed      Call MD for:  temperature >100.4    Complete by:  As directed      Diet - low sodium heart healthy    Complete by:  As directed      Discharge instructions    Complete by:  As directed   Call [phone] for a followup appointment. Take a stool softener while you are using pain medications. Call Dr. Ronnie Derby office to schedule a follow-up appointment with him next week.     Driving Restrictions    Complete by:  As directed   Do not drive for 2 weeks.     Increase activity slowly    Complete by:  As directed      Lifting restrictions    Complete by:  As directed   Do not lift more than 5 pounds. No excessive bending or twisting.     May shower / Bathe    Complete by:  As directed   He may shower after the pain she is removed 3 days after surgery. Leave the incision alone.     No dressing needed    Complete by:  As directed             Medication List    TAKE these medications        desmopressin 0.1 MG tablet  Commonly  known as:  DDAVP  Take 1 tablet (0.1 mg total) by mouth 2 (two) times daily.     dexamethasone 4 MG tablet  Commonly known as:  DECADRON  Take 1 tablet (4 mg total) by mouth 3 (three) times daily.     docusate sodium 100 MG capsule  Commonly known as:  COLACE  Take 1 capsule (100 mg total) by mouth 2 (two) times daily.     etonogestrel-ethinyl estradiol 0.12-0.015 MG/24HR vaginal ring  Commonly known as:  NUVARING  Insert vaginally and leave in place for 3 consecutive weeks, then remove for 1 week.     HYDROcodone-acetaminophen 5-325 MG per tablet  Commonly known as:  NORCO/VICODIN  Take 1 tablet by mouth every 4 (four) hours as needed for moderate pain.     ibuprofen 200 MG tablet  Commonly known as:  ADVIL,MOTRIN  Take 600 mg by mouth every 6 (six) hours  as needed for fever or headache.           Follow-up Information    Follow up with Melony Overly, MD In 3 days.   Specialty:  Otolaryngology   Why:  Monday at 4:15 at Dr [facility] office   Contact information:   [place] [address] [phone]       Signed: Ophelia Charter 04/02/2015, 12:59 PM

## 2015-04-06 ENCOUNTER — Telehealth: Payer: Self-pay | Admitting: Endocrinology

## 2015-04-06 NOTE — Telephone Encounter (Signed)
The only prescription I'm giving her is DDAVP 3 times a day and she had 90 tablets prescribed on 8/26. Also we need to work her in for appointment within the next 2 weeks.   She is not a new patient

## 2015-04-06 NOTE — Telephone Encounter (Signed)
Please see below.

## 2015-04-06 NOTE — Telephone Encounter (Signed)
No, we can't prescribe medication for a patient we've never seen.

## 2015-04-06 NOTE — Telephone Encounter (Signed)
He saw her in the hospital and was the one that instructed her to take it

## 2015-04-06 NOTE — Telephone Encounter (Signed)
Pt schedule for your 1st available np appt but she will be out of meds in one week can we bridge her

## 2015-04-06 NOTE — Telephone Encounter (Signed)
Please see below and advise.

## 2015-04-07 ENCOUNTER — Emergency Department (HOSPITAL_COMMUNITY)
Admission: EM | Admit: 2015-04-07 | Discharge: 2015-04-07 | Disposition: A | Discharge status: Home / Self Care | Payer: Medicaid Other | Attending: Emergency Medicine | Admitting: Emergency Medicine

## 2015-04-07 ENCOUNTER — Encounter (HOSPITAL_COMMUNITY): Payer: Self-pay | Admitting: *Deleted

## 2015-04-07 ENCOUNTER — Emergency Department (HOSPITAL_COMMUNITY): Payer: Medicaid Other

## 2015-04-07 DIAGNOSIS — E872 Acidosis: Secondary | ICD-10-CM | POA: Diagnosis not present

## 2015-04-07 DIAGNOSIS — R42 Dizziness and giddiness: Secondary | ICD-10-CM | POA: Diagnosis present

## 2015-04-07 DIAGNOSIS — N390 Urinary tract infection, site not specified: Secondary | ICD-10-CM | POA: Diagnosis not present

## 2015-04-07 DIAGNOSIS — Z8742 Personal history of other diseases of the female genital tract: Secondary | ICD-10-CM | POA: Insufficient documentation

## 2015-04-07 DIAGNOSIS — Z9889 Other specified postprocedural states: Secondary | ICD-10-CM | POA: Insufficient documentation

## 2015-04-07 DIAGNOSIS — Z8669 Personal history of other diseases of the nervous system and sense organs: Secondary | ICD-10-CM | POA: Diagnosis not present

## 2015-04-07 DIAGNOSIS — J3489 Other specified disorders of nose and nasal sinuses: Secondary | ICD-10-CM | POA: Diagnosis not present

## 2015-04-07 DIAGNOSIS — D72829 Elevated white blood cell count, unspecified: Secondary | ICD-10-CM | POA: Insufficient documentation

## 2015-04-07 DIAGNOSIS — R000 Tachycardia, unspecified: Secondary | ICD-10-CM | POA: Diagnosis not present

## 2015-04-07 DIAGNOSIS — R002 Palpitations: Secondary | ICD-10-CM | POA: Insufficient documentation

## 2015-04-07 LAB — CBC
HCT: 42.9 % (ref 36.0–46.0)
Hemoglobin: 14.2 g/dL (ref 12.0–15.0)
MCH: 27.8 pg (ref 26.0–34.0)
MCHC: 33.1 g/dL (ref 30.0–36.0)
MCV: 84 fL (ref 78.0–100.0)
Platelets: 409 10*3/uL — ABNORMAL HIGH (ref 150–400)
RBC: 5.11 MIL/uL (ref 3.87–5.11)
RDW: 15.7 % — AB (ref 11.5–15.5)
WBC: 26.5 10*3/uL — AB (ref 4.0–10.5)

## 2015-04-07 LAB — BASIC METABOLIC PANEL
Anion gap: 10 (ref 5–15)
BUN: 7 mg/dL (ref 6–20)
CHLORIDE: 102 mmol/L (ref 101–111)
CO2: 28 mmol/L (ref 22–32)
CREATININE: 0.76 mg/dL (ref 0.44–1.00)
Calcium: 9.5 mg/dL (ref 8.9–10.3)
Glucose, Bld: 131 mg/dL — ABNORMAL HIGH (ref 65–99)
POTASSIUM: 4.2 mmol/L (ref 3.5–5.1)
Sodium: 140 mmol/L (ref 135–145)

## 2015-04-07 LAB — I-STAT VENOUS BLOOD GAS, ED
Acid-Base Excess: 4 mmol/L — ABNORMAL HIGH (ref 0.0–2.0)
BICARBONATE: 29.4 meq/L — AB (ref 20.0–24.0)
O2 SAT: 76 %
PCO2 VEN: 44.4 mmHg — AB (ref 45.0–50.0)
PO2 VEN: 40 mmHg (ref 30.0–45.0)
TCO2: 31 mmol/L (ref 0–100)
pH, Ven: 7.428 — ABNORMAL HIGH (ref 7.250–7.300)

## 2015-04-07 LAB — URINE MICROSCOPIC-ADD ON

## 2015-04-07 LAB — URINALYSIS, ROUTINE W REFLEX MICROSCOPIC
BILIRUBIN URINE: NEGATIVE
GLUCOSE, UA: NEGATIVE mg/dL
Ketones, ur: NEGATIVE mg/dL
Nitrite: NEGATIVE
PH: 6.5 (ref 5.0–8.0)
Protein, ur: NEGATIVE mg/dL
SPECIFIC GRAVITY, URINE: 1.014 (ref 1.005–1.030)
Urobilinogen, UA: 0.2 mg/dL (ref 0.0–1.0)

## 2015-04-07 LAB — I-STAT TROPONIN, ED: Troponin i, poc: 0 ng/mL (ref 0.00–0.08)

## 2015-04-07 LAB — I-STAT CG4 LACTIC ACID, ED: LACTIC ACID, VENOUS: 3.5 mmol/L — AB (ref 0.5–2.0)

## 2015-04-07 MED ORDER — SODIUM CHLORIDE 0.9 % IV BOLUS (SEPSIS)
1000.0000 mL | Freq: Once | INTRAVENOUS | Status: AC
Start: 2015-04-07 — End: 2015-04-07
  Administered 2015-04-07: 1000 mL via INTRAVENOUS

## 2015-04-07 MED ORDER — CIPROFLOXACIN HCL 500 MG PO TABS
500.0000 mg | ORAL_TABLET | Freq: Once | ORAL | Status: AC
  Administered 2015-04-07: 500 mg via ORAL
  Filled 2015-04-07: qty 1

## 2015-04-07 MED ORDER — GADOBENATE DIMEGLUMINE 529 MG/ML IV SOLN
15.0000 mL | Freq: Once | INTRAVENOUS | Status: AC | PRN
  Administered 2015-04-07: 15 mL via INTRAVENOUS

## 2015-04-07 MED ORDER — SODIUM CHLORIDE 0.9 % IV BOLUS (SEPSIS)
1000.0000 mL | Freq: Once | INTRAVENOUS | Status: AC
  Administered 2015-04-07: 1000 mL via INTRAVENOUS

## 2015-04-07 MED ORDER — CIPROFLOXACIN HCL 500 MG PO TABS: 500.0000 mg | ORAL_TABLET | Freq: Two times a day (BID) | ORAL | Status: DC

## 2015-04-07 NOTE — Discharge Instructions (Signed)
As discussed, your evaluation today has been largely reassuring.  But, it is important that you monitor your condition carefully, and do not hesitate to return to the ED if you develop new, or concerning changes in your condition.  There is some evidence for a urinary tract infection, and you are starting antibiotics.  Please follow-up with your physician for appropriate ongoing care.

## 2015-04-07 NOTE — ED Notes (Signed)
Pt remains in xray.

## 2015-04-07 NOTE — ED Notes (Signed)
Patient transported to X-ray 

## 2015-04-07 NOTE — ED Provider Notes (Signed)
CSN: [id number]     Arrival date & time 04/07/15  1538 History   First MD Initiated Contact with Patient 04/07/15 1607     Chief Complaint  Patient presents with  . Post-op Problem     (Consider location/radiation/quality/duration/timing/severity/associated sxs/prior Treatment) HPI Patient presents today after being discharged from this facility following transsphenoidal pituitary resection, now with concern of lightheadedness, palpitations. Patient notes that on discharge she had neither lightheadedness, no palpitations. Symptoms began over the past 24 hours, have been progressive. She states that she takes all medication as directed, has no new asymmetric weakness, no new fever. No confusion, disorientation, vomiting. No clear relieving, exacerbating, precipitating factors.   Past Medical History  Diagnosis Date  . Medical history non-contributory   . Pregnancy induced hypertension     end of preg  . Bartholin cyst   . Multiple sclerosis    Past Surgical History  Procedure Laterality Date  . No past surgeries      NO PREVIOUS SURGERIES  . Craniotomy N/A 03/30/2015    Procedure: CRANIOTOMY HYPOPHYSECTOMY TRANSNASAL APPROACH;  Surgeon: Karie Chimera, MD;  Location: [facility];  Service: Neurosurgery;  Laterality: N/A;  . Transnasal approach N/A 03/30/2015    Procedure: TRANSNASAL APPROACH;  Surgeon: Rozetta Nunnery, MD;  Location: [facility];  Service: ENT;  Laterality: N/A;   Family History  Problem Relation Age of Onset  . Hypertension Father   . Diabetes Maternal Grandmother   . Diabetes Paternal Grandmother    Social History  Substance Use Topics  . Smoking status: Never Smoker   . Smokeless tobacco: Never Used  . Alcohol Use: 0.0 oz/week    0 Standard drinks or equivalent per week     Comment: every other weekend   OB History    Gravida Para Term Preterm AB TAB SAB Ectopic Multiple Living   1 1 1  0 0 0 0 0 0 1     Review of Systems  Constitutional:        Per HPI, otherwise negative  HENT: Positive for congestion.   Respiratory:       Per HPI, otherwise negative  Cardiovascular:       Per HPI, otherwise negative  Gastrointestinal: Negative for vomiting.  Endocrine:       Negative aside from HPI  Genitourinary:       Neg aside from HPI   Musculoskeletal:       Per HPI, otherwise negative  Skin: Negative.   Neurological: Negative for syncope and weakness.      Allergies  Review of patient's allergies indicates no known allergies.  Home Medications   Prior to Admission medications   Medication Sig Start Date End Date Taking? Authorizing Provider  desmopressin (DDAVP) 0.1 MG tablet Take 1 tablet (0.1 mg total) by mouth 3 (three) times daily. 04/02/15  Yes Newman Pies, MD  dexamethasone (DECADRON) 4 MG tablet Take 1 tablet (4 mg total) by mouth 3 (three) times daily. 04/02/15  Yes Newman Pies, MD  docusate sodium (COLACE) 100 MG capsule Take 1 capsule (100 mg total) by mouth 2 (two) times daily. 04/02/15  Yes Newman Pies, MD  HYDROcodone-acetaminophen (NORCO/VICODIN) 5-325 MG per tablet Take 1 tablet by mouth every 4 (four) hours as needed for moderate pain. 04/02/15  Yes Newman Pies, MD  desmopressin (DDAVP) 0.1 MG tablet Take 1 tablet (0.1 mg total) by mouth 2 (two) times daily. 04/02/15   Newman Pies, MD  etonogestrel-ethinyl estradiol (NUVARING) 0.12-0.015 MG/24HR vaginal  ring Insert vaginally and leave in place for 3 consecutive weeks, then remove for 1 week. Patient not taking: Reported on 02/26/2015 12/25/14   Frazier Richards, MD  ibuprofen (ADVIL,MOTRIN) 200 MG tablet Take 600 mg by mouth every 6 (six) hours as needed for fever or headache.    Historical Provider, MD   BP 124/80 mmHg  Pulse 102  Temp(Src) 98.2 F (36.8 C) (Oral)  Resp 19  Ht 5\' 2"  (1.575 m)  Wt 160 lb (72.576 kg)  BMI 29.26 kg/m2  SpO2 100%  LMP 03/01/2015 Physical Exam  Constitutional: She is oriented to person, place, and time. She  appears well-developed and well-nourished. No distress.  HENT:  Head: Normocephalic and atraumatic.  Dried evidence of rhinorrhea, right nasal passage  Eyes: Conjunctivae and EOM are normal.  Cardiovascular: Regular rhythm.  Tachycardia present.   Pulmonary/Chest: Effort normal and breath sounds normal. No stridor. No respiratory distress.  Abdominal: She exhibits no distension.  Musculoskeletal: She exhibits no edema.  Neurological: She is alert and oriented to person, place, and time. No cranial nerve deficit. She exhibits normal muscle tone. Coordination normal.  Skin: Skin is warm and dry.  Psychiatric: She has a normal mood and affect.  Nursing note and vitals reviewed.   ED Course  Procedures (including critical care time) Labs Review Labs Reviewed  BASIC METABOLIC PANEL - Abnormal; Notable for the following:    Glucose, Bld 131 (*)    All other components within normal limits  CBC - Abnormal; Notable for the following:    WBC 26.5 (*)    RDW 15.7 (*)    Platelets 409 (*)    All other components within normal limits  I-STAT CG4 LACTIC ACID, ED - Abnormal; Notable for the following:    Lactic Acid, Venous 3.50 (*)    All other components within normal limits  I-STAT VENOUS BLOOD GAS, ED - Abnormal; Notable for the following:    pH, Ven 7.428 (*)    pCO2, Ven 44.4 (*)    Bicarbonate 29.4 (*)    Acid-Base Excess 4.0 (*)    All other components within normal limits  URINALYSIS, ROUTINE W REFLEX MICROSCOPIC (NOT AT [facility])  Randolm Idol, ED    Imaging Review Dg Chest 2 View  04/07/2015   CLINICAL DATA:  Tachycardia. Recent surgery for pituitary tumor removal.  EXAM: CHEST  2 VIEW  COMPARISON:  None.  FINDINGS: The cardiac silhouette, mediastinal and hilar contours are within normal limits. The lungs are clear. No pleural effusion. The bony thorax is intact.  IMPRESSION: No acute cardiopulmonary findings.   Electronically Signed   By: Marijo Sanes M.D.   On: 04/07/2015  16:41   I have personally reviewed and evaluated these images and lab results as part of my medical decision-making.   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 Heart rate has diminished. I have discussed her presentation with the neurosurgery team.  Patient will have MRI.  On repeat exam after patient returned from MRI, heart rate is normal, she states that she feels better. I discussed all findings with the patient and her mother. Patient will follow up with neurosurgery as an outpatient.  MDM   Final diagnoses:  Palpitations  UTI (lower urinary tract infection)  Leukocytosis   Young female presents several days after transsphenoidal resection of a pituitary cyst. Patient complaint of palpitations, lightheadedness suggests post operative course versus swelling. Patient is tachycardic, and with lactic acidosis, leukocytosis, infection is a consideration, but MRI does not show obvious changes consistent with this, and the patient's heart rate declined to a normal range. Leukocytosis may be secondary to steroids, reactive. With reassuring MRI, improvement in her clinical condition, she will follow-up as an outpatient. Patient does have some evidence for urinary tract infection, and was straight with antibiotics as well.   Carmin Muskrat, MD 04/07/15 [phone]

## 2015-04-07 NOTE — ED Notes (Signed)
Pt reports she had a pitutitary tumor removed on Tuesday. Pt reports she had some lightheadedness that started right after her surgery. Pt reports mild headache and nausea, denies fever. Pt alert, oriented.

## 2015-04-07 NOTE — ED Notes (Signed)
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 Pt states that she has lightheadedness and feels like her heart is racing.

## 2015-04-09 LAB — URINE CULTURE

## 2015-04-20 ENCOUNTER — Ambulatory Visit: Payer: Medicaid Other | Admitting: Neurology

## 2015-04-26 ENCOUNTER — Encounter (HOSPITAL_COMMUNITY): Payer: Self-pay | Admitting: *Deleted

## 2015-04-26 ENCOUNTER — Emergency Department (HOSPITAL_COMMUNITY)
Admission: EM | Admit: 2015-04-26 | Discharge: 2015-04-26 | Disposition: A | Discharge status: Home / Self Care | Payer: Medicaid Other | Attending: Emergency Medicine | Admitting: Emergency Medicine

## 2015-04-26 DIAGNOSIS — Z8669 Personal history of other diseases of the nervous system and sense organs: Secondary | ICD-10-CM | POA: Diagnosis not present

## 2015-04-26 DIAGNOSIS — Z79899 Other long term (current) drug therapy: Secondary | ICD-10-CM | POA: Diagnosis not present

## 2015-04-26 DIAGNOSIS — R220 Localized swelling, mass and lump, head: Secondary | ICD-10-CM | POA: Insufficient documentation

## 2015-04-26 DIAGNOSIS — Z8742 Personal history of other diseases of the female genital tract: Secondary | ICD-10-CM | POA: Insufficient documentation

## 2015-04-26 MED ORDER — FAMOTIDINE 20 MG PO TABS: 20.0000 mg | ORAL_TABLET | Freq: Every day | ORAL | Status: DC

## 2015-04-26 MED ORDER — EPINEPHRINE 0.3 MG/0.3ML IJ SOAJ: 0.3000 mg | Freq: Once | INTRAMUSCULAR | Status: DC

## 2015-04-26 MED ORDER — PREDNISONE 20 MG PO TABS: 60.0000 mg | ORAL_TABLET | Freq: Every day | ORAL | Status: DC

## 2015-04-26 MED ORDER — DIPHENHYDRAMINE HCL 25 MG PO CAPS: 25.0000 mg | ORAL_CAPSULE | Freq: Three times a day (TID) | ORAL | Status: DC | PRN

## 2015-04-26 MED ORDER — DIPHENHYDRAMINE HCL 25 MG PO CAPS
25.0000 mg | ORAL_CAPSULE | Freq: Once | ORAL | Status: AC
  Administered 2015-04-26: 25 mg via ORAL
  Filled 2015-04-26: qty 1

## 2015-04-26 MED ORDER — FAMOTIDINE 20 MG PO TABS
20.0000 mg | ORAL_TABLET | Freq: Once | ORAL | Status: AC
  Administered 2015-04-26: 20 mg via ORAL
  Filled 2015-04-26: qty 1

## 2015-04-26 MED ORDER — PREDNISONE 20 MG PO TABS
60.0000 mg | ORAL_TABLET | Freq: Once | ORAL | Status: AC
  Administered 2015-04-26: 60 mg via ORAL
  Filled 2015-04-26: qty 3

## 2015-04-26 NOTE — Discharge Instructions (Signed)
You were seen today for possible allergic reaction. You have noted swelling around your face.  You had no other evidence of allergic reaction. You will be treated with Benadryl, Pepcid, and steroidal. You will need to follow-up with your surgeon for further recommendations regarding antibiotics.  Allergies Allergies may happen from anything your body is sensitive to. This may be food, medicines, pollens, chemicals, and nearly anything around you in everyday life that produces allergens. An allergen is anything that causes an allergy producing substance. Heredity is often a factor in causing these problems. This means you may have some of the same allergies as your parents. Food allergies happen in all age groups. Food allergies are some of the most severe and life threatening. Some common food allergies are cow's milk, seafood, eggs, nuts, wheat, and soybeans. SYMPTOMS   Swelling around the mouth.  An itchy red rash or hives.  Vomiting or diarrhea.  Difficulty breathing. SEVERE ALLERGIC REACTIONS ARE LIFE-THREATENING. This reaction is called anaphylaxis. It can cause the mouth and throat to swell and cause difficulty with breathing and swallowing. In severe reactions only a trace amount of food (for example, peanut oil in a salad) may cause death within seconds. Seasonal allergies occur in all age groups. These are seasonal because they usually occur during the same season every year. They may be a reaction to molds, grass pollens, or tree pollens. Other causes of problems are house dust mite allergens, pet dander, and mold spores. The symptoms often consist of nasal congestion, a runny itchy nose associated with sneezing, and tearing itchy eyes. There is often an associated itching of the mouth and ears. The problems happen when you come in contact with pollens and other allergens. Allergens are the particles in the air that the body reacts to with an allergic reaction. This causes you to release  allergic antibodies. Through a chain of events, these eventually cause you to release histamine into the blood stream. Although it is meant to be protective to the body, it is this release that causes your discomfort. This is why you were given anti-histamines to feel better. If you are unable to pinpoint the offending allergen, it may be determined by skin or blood testing. Allergies cannot be cured but can be controlled with medicine. Hay fever is a collection of all or some of the seasonal allergy problems. It may often be treated with simple over-the-counter medicine such as diphenhydramine. Take medicine as directed. Do not drink alcohol or drive while taking this medicine. Check with your caregiver or package insert for child dosages. If these medicines are not effective, there are many new medicines your caregiver can prescribe. Stronger medicine such as nasal spray, eye drops, and corticosteroids may be used if the first things you try do not work well. Other treatments such as immunotherapy or desensitizing injections can be used if all else fails. Follow up with your caregiver if problems continue. These seasonal allergies are usually not life threatening. They are generally more of a nuisance that can often be handled using medicine. HOME CARE INSTRUCTIONS   If unsure what causes a reaction, keep a diary of foods eaten and symptoms that follow. Avoid foods that cause reactions.  If hives or rash are present:  Take medicine as directed.  You may use an over-the-counter antihistamine (diphenhydramine) for hives and itching as needed.  Apply cold compresses (cloths) to the skin or take baths in cool water. Avoid hot baths or showers. Heat will make a rash  and itching worse.  If you are severely allergic:  Following a treatment for a severe reaction, hospitalization is often required for closer follow-up.  Wear a medic-alert bracelet or necklace stating the allergy.  You and your family  must learn how to give adrenaline or use an anaphylaxis kit.  If you have had a severe reaction, always carry your anaphylaxis kit or EpiPen with you. Use this medicine as directed by your caregiver if a severe reaction is occurring. Failure to do so could have a fatal outcome. SEEK MEDICAL CARE IF:  You suspect a food allergy. Symptoms generally happen within 30 minutes of eating a food.  Your symptoms have not gone away within 2 days or are getting worse.  You develop new symptoms.  You want to retest yourself or your child with a food or drink you think causes an allergic reaction. Never do this if an anaphylactic reaction to that food or drink has happened before. Only do this under the care of a caregiver. SEEK IMMEDIATE MEDICAL CARE IF:   You have difficulty breathing, are wheezing, or have a tight feeling in your chest or throat.  You have a swollen mouth, or you have hives, swelling, or itching all over your body.  You have had a severe reaction that has responded to your anaphylaxis kit or an EpiPen. These reactions may return when the medicine has worn off. These reactions should be considered life threatening. MAKE SURE YOU:   Understand these instructions.  Will watch your condition.  Will get help right away if you are not doing well or get worse. Document Released: 10/17/2002 Document Revised: 11/18/2012 Document Reviewed: 03/23/2008 [facility] Patient Information 2015 [place], [place]. This information is not intended to replace advice given to you by your health care provider. Make sure you discuss any questions you have with your health care provider.

## 2015-04-26 NOTE — ED Provider Notes (Signed)
CSN: [id number]     Arrival date & time 04/26/15  0131 History  This chart was scribed for Gloria Hacker, MD by Ludger Nutting, ED Scribe. This patient was seen in room A08C/A08C and the patient's care was started 2:49 AM.    Chief Complaint  Patient presents with  . Allergic Reaction    HPI   HPI Comments: Gloria Adams is a 24 y.o. female who presents to the Emergency Department complaining of an allergic reaction involving facial swelling and puffiness that began ~24 hours ago. 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 She states her face was puffy after the procedure is worsened since she started the antibiotic. She states her last dose was yesterday. she is concerned this is related to the medication. She denies fever, throat swelling, trouble swallowing, SOB, neck pain, neck stiffness. She denies any known drug allergies.   Past Medical History  Diagnosis Date  . Medical history non-contributory   . Pregnancy induced hypertension     end of preg  . Bartholin cyst   . Multiple sclerosis    Past Surgical History  Procedure Laterality Date  . No past surgeries      NO PREVIOUS SURGERIES  . Craniotomy N/A 03/30/2015    Procedure: CRANIOTOMY HYPOPHYSECTOMY TRANSNASAL APPROACH;  Surgeon: Karie Chimera, MD;  Location: [facility];  Service: Neurosurgery;  Laterality: N/A;  . Transnasal approach N/A 03/30/2015    Procedure: TRANSNASAL APPROACH;  Surgeon: Rozetta Nunnery, MD;  Location: [facility];  Service: ENT;  Laterality: N/A;   Family History  Problem Relation Age of Onset  . Hypertension Father   . Diabetes Maternal Grandmother   . Diabetes Paternal Grandmother    Social History  Substance Use Topics  . Smoking status: Never Smoker   . Smokeless tobacco: Never Used  . Alcohol Use: 0.0 oz/week    0  Standard drinks or equivalent per week     Comment: every other weekend   OB History    Gravida Para Term Preterm AB TAB SAB Ectopic Multiple Living   1 1 1  0 0 0 0 0 0 1     Review of Systems  Constitutional: Negative for fever.  HENT: Positive for facial swelling. Negative for sore throat, trouble swallowing and voice change.   Respiratory: Negative for shortness of breath.   Musculoskeletal: Negative for neck pain and neck stiffness.  All other systems reviewed and are negative.     Allergies  Review of patient's allergies indicates no known allergies.  Home Medications   Prior to Admission medications   Medication Sig Start Date End Date Taking? Authorizing [place]  ciprofloxacin (CIPRO) 500 MG tablet Take 1 tablet (500 mg total) by mouth 2 (two) times daily. 04/07/15   Carmin Muskrat, MD  desmopressin (DDAVP) 0.1 MG tablet Take 1 tablet (0.1 mg total) by mouth 3 (three) times daily. 04/02/15   Newman Pies, MD  dexamethasone (DECADRON) 4 MG tablet Take 1 tablet (4 mg total) by mouth 3 (three) times daily. 04/02/15   Newman Pies, MD  diphenhydrAMINE (BENADRYL) 25 mg capsule Take 1 capsule (25 mg total) by mouth every 8 (eight) hours as needed for allergies. 04/26/15   Gloria Hacker, MD  docusate sodium (COLACE) 100 MG capsule Take 1 capsule (100 mg total) by mouth  2 (two) times daily. 04/02/15   Newman Pies, MD  EPINEPHrine 0.3 mg/0.3 mL IJ SOAJ injection Inject 0.3 mLs (0.3 mg total) into the muscle once. 04/26/15   Gloria Hacker, MD  famotidine (PEPCID) 20 MG tablet Take 1 tablet (20 mg total) by mouth daily. 04/26/15   Gloria Hacker, MD  HYDROcodone-acetaminophen (NORCO/VICODIN) 5-325 MG per tablet Take 1 tablet by mouth every 4 (four) hours as needed for moderate pain. 04/02/15   Newman Pies, MD  predniSONE (DELTASONE) 20 MG tablet Take 3 tablets (60 mg total) by mouth daily with breakfast. 04/26/15   Gloria Hacker, MD   BP 129/80 mmHg  Pulse 89   Temp(Src) 98.5 F (36.9 C)  Resp 16  SpO2 100% Physical Exam  Constitutional: She is oriented to person, place, and time. She appears well-developed and well-nourished. No distress.  HENT:  Head: Normocephalic and atraumatic.  Mouth/Throat: Oropharynx is clear and moist.  Mild symmetric swelling noted to the face, no oral pharyngeal or neck swelling noted, normal range of motion of the neck  Eyes: Pupils are equal, round, and reactive to light.  Neck: Neck supple.  Cardiovascular: Normal rate, regular rhythm and normal heart sounds.   No murmur heard. Pulmonary/Chest: Effort normal and breath sounds normal. No respiratory distress. She has no wheezes.  Abdominal: Soft. Bowel sounds are normal. There is no tenderness. There is no rebound and no guarding.  Lymphadenopathy:    She has no cervical adenopathy.  Neurological: She is alert and oriented to person, place, and time.  Skin: Skin is warm and dry. No rash noted.  Psychiatric: She has a normal mood and affect.  Nursing note and vitals reviewed.   ED Course  Procedures (including critical care time)  DIAGNOSTIC STUDIES: Oxygen Saturation is 100% on RA, normal by my interpretation.    COORDINATION OF CARE: 2:53 AM Discussed treatment plan with pt at bedside and pt agreed to plan.   Labs Review Labs Reviewed - No data to display  Imaging Review No results found.    EKG Interpretation None      MDM   Final diagnoses:  Facial swelling    Patient presents with concerns for facial swelling following antibiotics use. Minimal symmetric swelling noted on exam. No other signs or symptoms of anaphylaxis. Patient subjectively feels like she is much more swollen than she was prior to antibiotics use. For this reason, I discussed with patient discontinuing antibiotics use in following up with her primary physician for recommendations regarding a different antibiotic.  Patient was given Benadryl, Pepcid, and prednisone. She is  no longer on Decadron postoperatively. Will place on a four-day course of burst steriods.  After history, exam, and medical workup I feel the patient has been appropriately medically screened and is safe for discharge home. Pertinent diagnoses were discussed with the patient. Patient was given return precautions.  I personally performed the services described in this documentation, which was scribed in my presence. The recorded information has been reviewed and is accurate.   Gloria Hacker, MD 04/26/15 [phone]

## 2015-04-26 NOTE — ED Notes (Signed)
Pt states that she had a pituitary cyst removed on the 23rd. States that she has been taking Cephalxein since the 16th and thinks that her face has gotten swollen from it. Also states that there is a weird smell in her nose since starting the medicine.

## 2015-04-30 ENCOUNTER — Ambulatory Visit (INDEPENDENT_AMBULATORY_CARE_PROVIDER_SITE_OTHER): Payer: Medicaid Other | Admitting: Endocrinology

## 2015-04-30 ENCOUNTER — Encounter: Payer: Self-pay | Admitting: Endocrinology

## 2015-04-30 VITALS — BP 140/92 | HR 114 | Temp 97.8°F | Resp 14 | Ht 62.0 in | Wt 164.0 lb

## 2015-04-30 DIAGNOSIS — E232 Diabetes insipidus: Secondary | ICD-10-CM | POA: Diagnosis not present

## 2015-04-30 DIAGNOSIS — R002 Palpitations: Secondary | ICD-10-CM | POA: Diagnosis not present

## 2015-04-30 DIAGNOSIS — D352 Benign neoplasm of pituitary gland: Secondary | ICD-10-CM

## 2015-04-30 LAB — URINALYSIS, ROUTINE W REFLEX MICROSCOPIC
BILIRUBIN URINE: NEGATIVE
KETONES UR: NEGATIVE
LEUKOCYTES UA: NEGATIVE
NITRITE: NEGATIVE
Specific Gravity, Urine: 1.02 (ref 1.000–1.030)
TOTAL PROTEIN, URINE-UPE24: NEGATIVE
URINE GLUCOSE: NEGATIVE
UROBILINOGEN UA: 0.2 (ref 0.0–1.0)
pH: 6 (ref 5.0–8.0)

## 2015-04-30 LAB — T4, FREE: Free T4: 0.73 ng/dL (ref 0.60–1.60)

## 2015-04-30 LAB — COMPREHENSIVE METABOLIC PANEL
ALT: 23 U/L (ref 0–35)
AST: 12 U/L (ref 0–37)
Albumin: 3.9 g/dL (ref 3.5–5.2)
Alkaline Phosphatase: 40 U/L (ref 39–117)
BUN: 27 mg/dL — ABNORMAL HIGH (ref 6–23)
CALCIUM: 9.1 mg/dL (ref 8.4–10.5)
CHLORIDE: 102 meq/L (ref 96–112)
CO2: 25 meq/L (ref 19–32)
Creatinine, Ser: 0.61 mg/dL (ref 0.40–1.20)
GFR: 157.45 mL/min (ref >60.00)
Glucose, Bld: 72 mg/dL (ref 70–99)
POTASSIUM: 3.4 meq/L — AB (ref 3.5–5.1)
Sodium: 138 mEq/L (ref 135–145)
Total Bilirubin: 0.3 mg/dL (ref 0.2–1.2)
Total Protein: 6.8 g/dL (ref 6.0–8.3)

## 2015-04-30 MED ORDER — HYDROCORTISONE 20 MG PO TABS: ORAL_TABLET | ORAL | Status: DC

## 2015-04-30 MED ORDER — METOPROLOL SUCCINATE ER 50 MG PO TB24: 50.0000 mg | ORAL_TABLET | Freq: Every day | ORAL | Status: DC

## 2015-04-30 MED ORDER — DESMOPRESSIN ACETATE 0.1 MG PO TABS: 0.1000 mg | ORAL_TABLET | Freq: Every day | ORAL | Status: DC

## 2015-04-30 NOTE — Progress Notes (Signed)
Patient ID: Gloria Adams, female   DOB: [birth year], 24 y.o.   MRN: [id number]           Chief complaint: Has many symptoms today  PITUITARY tumor follow-up:  Patient had presented to the optician with history of blurred vision and subsequently was seen by neurologist who found her pituitary lesion on MRI scan. She had surgery for a sellar and suprasellar cystic lesion on 03/30/15  She says her eyesight is better  Postoperatively the patient of diabetes insipidus With starting DDAVP 0.1 mg 3 times a day her urine output improved significantly and she was discharged on 0.1 mg twice a day of the DDAVP. On her own she stopped taking this medication She still thinks that she has some increased urination at night up to 4 times but not as much in the daytime However she had been drinking a lot of water on her own and she still drinks at least 4 containers of 32 ounces daily still She thinks her frequent urination is somewhat better and last night got overly 2 times  ENDOCRINE deficits:  She reportedly had endocrine evaluation prior to surgery but no results are available from the neurosurgeon's office She had menstrual cycles after her pregnancy about a year ago and had stopped lactating She does complain of feeling significant fatigue but this is only since her hospital discharge.  She feels hot at times. She was apparently discharged from the hospital on dexamethasone 4 mg 3 times a day and she still is taking it, was seen by neurosurgeon postoperatively and he did not change her dose  Patient also said that she feels a lot of palpitations and her heart is fast especially after she is eating a meal She feels dizzy sometimes She went to the emergency room when her face was getting swollen and was given prednisone for 4 days on Monday She also is starting to gain weight She will sometimes have feeling of difficulty focusing She feels very anxious and has some difficulty sleeping at  night   Lab Results  Component Value Date   CREATININE 0.76 04/07/2015   BUN 7 04/07/2015   NA 140 04/07/2015   K 4.2 04/07/2015   CL 102 04/07/2015   CO2 28 04/07/2015      History of Present Illness:    Medication List       This list is accurate as of: 04/30/15  1:56 PM.  Always use your most recent med list.               desmopressin 0.1 MG tablet  Commonly known as:  DDAVP  Take 1 tablet (0.1 mg total) by mouth at bedtime.     EPINEPHrine 0.3 mg/0.3 mL Soaj injection  Commonly known as:  EPI-PEN  Inject 0.3 mLs (0.3 mg total) into the muscle once.     hydrocortisone 20 MG tablet  Commonly known as:  CORTEF  Take 2 tablets in the morning and 1 at 5 PM for the next 3 days and then one tablet in the morning and half tablet in the evening     metoprolol succinate 50 MG 24 hr tablet  Commonly known as:  TOPROL-XL  Take 1 tablet (50 mg total) by mouth daily. Take with or immediately following a meal.        Allergies: No Known Allergies  Past Medical History  Diagnosis Date  . Medical history non-contributory   . Pregnancy induced hypertension     end of  preg  . Bartholin cyst   . Multiple sclerosis     Past Surgical History  Procedure Laterality Date  . No past surgeries      NO PREVIOUS SURGERIES  . Craniotomy N/A 03/30/2015    Procedure: CRANIOTOMY HYPOPHYSECTOMY TRANSNASAL APPROACH;  Surgeon: Karie Chimera, MD;  Location: [facility];  Service: Neurosurgery;  Laterality: N/A;  . Transnasal approach N/A 03/30/2015    Procedure: TRANSNASAL APPROACH;  Surgeon: Rozetta Nunnery, MD;  Location: [facility];  Service: ENT;  Laterality: N/A;    Family History  Problem Relation Age of Onset  . Hypertension Father   . Diabetes Maternal Grandmother   . Diabetes Paternal Grandmother     Social History:  reports that she has never smoked. She has never used smokeless tobacco. She reports that she drinks alcohol. She reports that she does not use  illicit drugs.  Review of Systems   Review of Systems  Constitutional: Positive for malaise/fatigue. Negative for fever.  HENT:       Has a feeling of fullness  Eyes: Positive for blurred vision.  Cardiovascular: Positive for palpitations.  Gastrointestinal: Positive for constipation.  Neurological: Positive for dizziness, weakness and headaches.  Psychiatric/Behavioral: The patient is nervous/anxious.      EXAM:  BP 140/92 mmHg  Pulse 114  Temp(Src) 97.8 F (36.6 C)  Resp 14  Ht 5\' 2"  (1.575 m)  Wt 164 lb (74.39 kg)  BMI 29.99 kg/m2  SpO2 96%  Physical Exam  Constitutional: She appears well-developed and well-nourished.  HENT:  Mucous membranes are moist  Cardiovascular: Regular rhythm and normal heart sounds.   Pulmonary/Chest: No respiratory distress.  Neurological: She is alert. She displays abnormal reflex.  No tremor present.  Biceps reflexes are brisk  Skin: Skin is warm and dry. No pallor.  Vitals reviewed.   Assessment/Plan:   PITUITARY tumor:  She has not had any assessment of her endocrine status and no labs are available from preoperative evaluation. Currently the patient is on high-dose dexamethasone and does not need to continue this.  Since she is about 4 weeks postoperative we can check her free thyroxine level to rule out secondary hypothyroidism She generally has had regular menstrual cycles recently ruling out hypogonadotropic hypogonadism No prolactin level is available on record and will check this today also  Cortisone supplementation: Will switch her to hydrocortisone 60 mg a day for 3 days and then 30 mg a day until next visit.  Most likely will be able to taper this off after assessment of 24 urine free cortisol on the next visit  DIABETES insipidus: Difficult to assess her symptoms as she is drinking a lot of fluids anyway.  She does have some nocturia and she may still have some residual diabetes insipidus, not as symptomatic during the  daytime with not taking her DDAVP that was described at discharge   She has multiple symptoms and etiology is unclear.  Some of these symptoms may be related to taking high-dose steroids Not clear why she is having tachycardia. Will empirically have her take metoprolol ER to control her tachycardia and anxiety  KUMAR,AJAY 04/30/2015, 1:56 PM

## 2015-04-30 NOTE — Patient Instructions (Signed)
STOP dexamethasone. Take DDAVP at bedtime to reduce urination  Hydrocortisone: Take 2 tablets in the morning and 1 tablet at about 5 PM for 3 days and then one tablet in the morning and half tablet at suppertime until next visit  METOPROLOL: Take half to 1 tablet daily to help with palpitations and anxiety

## 2015-05-01 ENCOUNTER — Encounter (HOSPITAL_COMMUNITY): Payer: Self-pay

## 2015-05-01 ENCOUNTER — Emergency Department (HOSPITAL_COMMUNITY)
Admission: EM | Admit: 2015-05-01 | Discharge: 2015-05-01 | Disposition: A | Discharge status: Home / Self Care | Payer: Medicaid Other | Attending: Emergency Medicine | Admitting: Emergency Medicine

## 2015-05-01 DIAGNOSIS — R42 Dizziness and giddiness: Secondary | ICD-10-CM | POA: Diagnosis not present

## 2015-05-01 DIAGNOSIS — R002 Palpitations: Secondary | ICD-10-CM | POA: Diagnosis not present

## 2015-05-01 DIAGNOSIS — Z862 Personal history of diseases of the blood and blood-forming organs and certain disorders involving the immune mechanism: Secondary | ICD-10-CM | POA: Diagnosis not present

## 2015-05-01 DIAGNOSIS — Z8742 Personal history of other diseases of the female genital tract: Secondary | ICD-10-CM | POA: Diagnosis not present

## 2015-05-01 DIAGNOSIS — Z3202 Encounter for pregnancy test, result negative: Secondary | ICD-10-CM | POA: Diagnosis not present

## 2015-05-01 DIAGNOSIS — Z79899 Other long term (current) drug therapy: Secondary | ICD-10-CM | POA: Diagnosis not present

## 2015-05-01 DIAGNOSIS — R51 Headache: Secondary | ICD-10-CM | POA: Insufficient documentation

## 2015-05-01 DIAGNOSIS — Z8639 Personal history of other endocrine, nutritional and metabolic disease: Secondary | ICD-10-CM | POA: Diagnosis not present

## 2015-05-01 LAB — CBC WITH DIFFERENTIAL/PLATELET
BASOS ABS: 0 10*3/uL (ref 0.0–0.1)
Basophils Relative: 0 %
EOS ABS: 0 10*3/uL (ref 0.0–0.7)
Eosinophils Relative: 0 %
HCT: 39.4 % (ref 36.0–46.0)
Hemoglobin: 13 g/dL (ref 12.0–15.0)
LYMPHS ABS: 3.8 10*3/uL (ref 0.7–4.0)
Lymphocytes Relative: 34 %
MCH: 28.6 pg (ref 26.0–34.0)
MCHC: 33 g/dL (ref 30.0–36.0)
MCV: 86.8 fL (ref 78.0–100.0)
MONO ABS: 0.9 10*3/uL (ref 0.1–1.0)
Monocytes Relative: 8 %
NEUTROS ABS: 6.6 10*3/uL (ref 1.7–7.7)
Neutrophils Relative %: 58 %
PLATELETS: 222 10*3/uL (ref 150–400)
RBC: 4.54 MIL/uL (ref 3.87–5.11)
RDW: 19.1 % — AB (ref 11.5–15.5)
WBC: 11.3 10*3/uL — ABNORMAL HIGH (ref 4.0–10.5)

## 2015-05-01 LAB — POC URINE PREG, ED: PREG TEST UR: NEGATIVE

## 2015-05-01 LAB — URINE MICROSCOPIC-ADD ON

## 2015-05-01 LAB — BASIC METABOLIC PANEL
Anion gap: 5 (ref 5–15)
BUN: 16 mg/dL (ref 6–20)
CALCIUM: 8.4 mg/dL — AB (ref 8.9–10.3)
CO2: 29 mmol/L (ref 22–32)
CREATININE: 0.55 mg/dL (ref 0.44–1.00)
Chloride: 103 mmol/L (ref 101–111)
GFR calc non Af Amer: >60 mL/min (ref >60)
Glucose, Bld: 83 mg/dL (ref 65–99)
Potassium: 3.8 mmol/L (ref 3.5–5.1)
SODIUM: 137 mmol/L (ref 135–145)

## 2015-05-01 LAB — PROLACTIN: PROLACTIN: 25.2 ng/mL — AB (ref 4.8–23.3)

## 2015-05-01 LAB — URINALYSIS, ROUTINE W REFLEX MICROSCOPIC
BILIRUBIN URINE: NEGATIVE
Glucose, UA: NEGATIVE mg/dL
Ketones, ur: NEGATIVE mg/dL
Leukocytes, UA: NEGATIVE
Nitrite: NEGATIVE
Protein, ur: NEGATIVE mg/dL
SPECIFIC GRAVITY, URINE: 1.012 (ref 1.005–1.030)
UROBILINOGEN UA: 0.2 mg/dL (ref 0.0–1.0)
pH: 5 (ref 5.0–8.0)

## 2015-05-01 NOTE — ED Provider Notes (Signed)
CSN: [id number]     Arrival date & time 05/01/15  1001 History   First MD Initiated Contact with Patient 05/01/15 1028     No chief complaint on file.   HPI   Gloria Adams is a 24 y.o. 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 She reports she has experienced these symptoms since her surgery. She states her palpitations and dizziness occur together and are intermittent. She states eating triggers her symptoms. She is unable to identify any alleviating factors. She reports she saw her endocrinologist yesterday, and was started on metoprolol, which has not helped.   Past Medical History  Diagnosis Date  . Medical history non-contributory   . Pregnancy induced hypertension     end of preg  . Bartholin cyst   . Multiple sclerosis    Past Surgical History  Procedure Laterality Date  . No past surgeries      NO PREVIOUS SURGERIES  . Craniotomy N/A 03/30/2015    Procedure: CRANIOTOMY HYPOPHYSECTOMY TRANSNASAL APPROACH;  Surgeon: Karie Chimera, MD;  Location: [facility];  Service: Neurosurgery;  Laterality: N/A;  . Transnasal approach N/A 03/30/2015    Procedure: TRANSNASAL APPROACH;  Surgeon: Rozetta Nunnery, MD;  Location: [facility];  Service: ENT;  Laterality: N/A;  . Transphenoidal / transnasal hypophysectomy / resection pituitary tumor     Family History  Problem Relation Age of Onset  . Hypertension Father   . Diabetes Maternal Grandmother   . Diabetes Paternal Grandmother    Social History  Substance Use Topics  . Smoking status: Never Smoker   . Smokeless tobacco: Never Used  . Alcohol Use: 0.0 oz/week    0 Standard drinks or equivalent per week     Comment: every other weekend   OB History    Gravida Para Term Preterm AB TAB SAB Ectopic Multiple Living   1 1 1  0 0 0 0 0 0 1      Review of Systems  Constitutional: Negative for fever and chills.  Respiratory:  Negative for shortness of breath.   Cardiovascular: Positive for palpitations. Negative for chest pain.  Gastrointestinal: Negative for nausea, vomiting, abdominal pain, diarrhea and constipation.  Genitourinary: Negative for dysuria, urgency and frequency.  Neurological: Positive for dizziness, light-headedness and headaches. Negative for syncope and weakness.       Reports dizziness and lightheadedness with palpitations. States she has intermittent headaches.  All other systems reviewed and are negative.     Allergies  Review of patient's allergies indicates no known allergies.  Home Medications   Prior to Admission medications   Medication Sig Start Date End Date Taking? Authorizing Provider  desmopressin (DDAVP) 0.1 MG tablet Take 1 tablet (0.1 mg total) by mouth at bedtime. Patient taking differently: Take 0.1 mg by mouth at bedtime as needed (flare).  04/30/15  Yes Elayne Snare, MD  EPINEPHrine 0.3 mg/0.3 mL IJ SOAJ injection Inject 0.3 mLs (0.3 mg total) into the muscle once. 04/26/15  Yes Merryl Hacker, MD  metoprolol succinate (TOPROL-XL) 50 MG 24 hr tablet Take 1 tablet (50 mg total) by mouth daily. Take with or immediately following a meal. 04/30/15  Yes Elayne Snare, MD  hydrocortisone (CORTEF) 20 MG tablet Take 2 tablets in the morning and 1 at 5 PM for the next 3 days and then one tablet in the morning and half tablet in the evening Patient not taking: Reported  on 05/01/2015 04/30/15   Elayne Snare, MD    BP 112/67 mmHg  Pulse 99  Temp(Src) 97.8 F (36.6 C) (Oral)  Resp 21  SpO2 100%  LMP 04/23/2015 (Approximate) Physical Exam  Constitutional: She is oriented to person, place, and time. She appears well-developed and well-nourished. No distress.  HENT:  Head: Normocephalic and atraumatic.  Right Ear: Tympanic membrane, external ear and ear canal normal.  Left Ear: Tympanic membrane, external ear and ear canal normal.  Nose: Nose normal.  Mouth/Throat: Uvula is midline,  oropharynx is clear and moist and mucous membranes are normal.  Eyes: Conjunctivae, EOM and lids are normal. Pupils are equal, round, and reactive to light. Right eye exhibits no discharge. Left eye exhibits no discharge. No scleral icterus.  Neck: Normal range of motion. Neck supple.  Cardiovascular: Normal rate, regular rhythm, normal heart sounds, intact distal pulses and normal pulses.   Pulmonary/Chest: Effort normal and breath sounds normal. No respiratory distress. She has no wheezes. She has no rales.  Abdominal: Soft. Normal appearance and bowel sounds are normal. She exhibits no distension and no mass. There is no tenderness. There is no rigidity, no rebound and no guarding.  Musculoskeletal: Normal range of motion. She exhibits no edema or tenderness.  Neurological: She is alert and oriented to person, place, and time. She has normal strength. No cranial nerve deficit or sensory deficit. Coordination normal.  Skin: Skin is warm, dry and intact. No rash noted. She is not diaphoretic. No erythema. No pallor.  Psychiatric: She has a normal mood and affect. Her speech is normal and behavior is normal. Judgment and thought content normal.  Nursing note and vitals reviewed.   ED Course  Procedures (including critical care time)  Labs Review Labs Reviewed  CBC WITH DIFFERENTIAL/PLATELET - Abnormal; Notable for the following:    WBC 11.3 (*)    RDW 19.1 (*)    All other components within normal limits  BASIC METABOLIC PANEL - Abnormal; Notable for the following:    Calcium 8.4 (*)    All other components within normal limits  URINALYSIS, ROUTINE W REFLEX MICROSCOPIC (NOT AT [facility]) - Abnormal; Notable for the following:    APPearance CLOUDY (*)    Hgb urine dipstick TRACE (*)    All other components within normal limits  URINE MICROSCOPIC-ADD ON  POC URINE PREG, ED    Imaging Review No results found.   I have personally reviewed and evaluated these images and lab results as part  of my medical decision-making.   EKG Interpretation   Date/Time:  Saturday May 01 2015 10:16:58 EDT Ventricular Rate:  94 PR Interval:  145 QRS Duration: 72 QT Interval:  408 QTC Calculation: 510 R Axis:   67 Text Interpretation:  Sinus rhythm Probable left atrial enlargement  Abnormal T, consider ischemia, anterior leads Prolonged QT interval T wave  inversion less pronounced than prior Confirmed by Maryan Rued  MD, Loree Fee  [phone]) on 05/01/2015 10:55:10 AM      MDM   Final diagnoses:  Heart palpitations  Dizziness    24 year old female presents with palpitations and dizziness, which she states started after her transphenoidal pituitary resection 08/23. She reports her symptoms are intermittent. She states her symptoms occur after she eats and last for a few hours.  Patient is afebrile. HR 90s. Heart regular rhythm. Lungs clear to auscultation bilaterally. Abdomen soft, non-tender, non-distended. No lower extremity edema. Normal neuro exam with no focal deficit.   EKG with  sinus rhythm, no acute ischemia. CBC with WBC 11.3, appears to be down-trending from prior. BMP within normal limits. UA no evidence of infection. Urine pregnancy negative.  Patient reports symptoms occur after she eats. Patient given food in the ED, and reports recurrence of palpitations and dizziness, however no significant tachycardia noted on the monitor.   Patient stable for discharge at this time. Return precautions discussed. Patient to follow-up with endocrinology and cardiology for further evaluation and management.  BP 112/67 mmHg  Pulse 99  Temp(Src) 97.8 F (36.6 C) (Oral)  Resp 21  SpO2 100%  LMP 04/23/2015 (Approximate)     Marella Chimes, PA-C 05/01/15 [place], MD 05/01/15 2203

## 2015-05-01 NOTE — ED Notes (Signed)
She states she under went transphenoidal pituitectomy in August of this year.  She states that around that time she noticed palpitations ("feels like my heart races a lot").  She is here with continual feeling of palpitating, however, EKG at triage shows nsr with heart rate of ~94.  She states she was placed on metoprolol by Dr. Dwyane Dee yesterday "but it isn't helping".  She states she still has pituitary tumor related issues with vision of her left eye.

## 2015-05-01 NOTE — Discharge Instructions (Signed)
1. Medications: usual home medications 2. Treatment: rest, drink plenty of fluids 3. Follow Up: please followup with cardiology and endocrinology for discussion of your diagnoses and further evaluation after today's visit; if you do not have a primary care doctor use the resource guide provided to find one; please return to the ER for chest pain, shortness of breath, loss of consciousness, new or worsening symptoms   Palpitations A palpitation is the feeling that your heartbeat is irregular or is faster than normal. It may feel like your heart is fluttering or skipping a beat. Palpitations are usually not a serious problem. However, in some cases, you may need further medical evaluation. CAUSES  Palpitations can be caused by:  Smoking.  Caffeine or other stimulants, such as diet pills or energy drinks.  Alcohol.  Stress and anxiety.  Strenuous physical activity.  Fatigue.  Certain medicines.  Heart disease, especially if you have a history of irregular heart rhythms (arrhythmias), such as atrial fibrillation, atrial flutter, or supraventricular tachycardia.  An improperly working pacemaker or defibrillator. DIAGNOSIS  To find the cause of your palpitations, your health care provider will take your medical history and perform a physical exam. Your health care provider may also have you take a test called an ambulatory electrocardiogram (ECG). An ECG records your heartbeat patterns over a 24-hour period. You may also have other tests, such as:  Transthoracic echocardiogram (TTE). During echocardiography, sound waves are used to evaluate how blood flows through your heart.  Transesophageal echocardiogram (TEE).  Cardiac monitoring. This allows your health care provider to monitor your heart rate and rhythm in real time.  Holter monitor. This is a portable device that records your heartbeat and can help diagnose heart arrhythmias. It allows your health care provider to track your  heart activity for several days, if needed.  Stress tests by exercise or by giving medicine that makes the heart beat faster. TREATMENT  Treatment of palpitations depends on the cause of your symptoms and can vary greatly. Most cases of palpitations do not require any treatment other than time, relaxation, and monitoring your symptoms. Other causes, such as atrial fibrillation, atrial flutter, or supraventricular tachycardia, usually require further treatment. HOME CARE INSTRUCTIONS   Avoid:  Caffeinated coffee, tea, soft drinks, diet pills, and energy drinks.  Chocolate.  Alcohol.  Stop smoking if you smoke.  Reduce your stress and anxiety. Things that can help you relax include:  A method of controlling things in your body, such as your heartbeats, with your mind (biofeedback).  Yoga.  Meditation.  Physical activity such as swimming, jogging, or walking.  Get plenty of rest and sleep. SEEK MEDICAL CARE IF:   You continue to have a fast or irregular heartbeat beyond 24 hours.  Your palpitations occur more often. SEEK IMMEDIATE MEDICAL CARE IF:  You have chest pain or shortness of breath.  You have a severe headache.  You feel dizzy or you faint. MAKE SURE YOU:  Understand these instructions.  Will watch your condition.  Will get help right away if you are not doing well or get worse. Document Released: 07/21/2000 Document Revised: 07/29/2013 Document Reviewed: 09/22/2011 [facility] Patient Information 2015 [place], [place]. This information is not intended to replace advice given to you by your health care provider. Make sure you discuss any questions you have with your health care provider.   Emergency Department Resource Guide 1) Find a Doctor and Pay Out of Pocket Although you won't have to find out who is covered  by your insurance plan, it is a good idea to ask around and get recommendations. You will then need to call the office and see if the doctor you have  chosen will accept you as a new patient and what types of options they offer for patients who are self-pay. Some doctors offer discounts or will set up payment plans for their patients who do not have insurance, but you will need to ask so you aren't surprised when you get to your appointment.  2) Contact Your Local Health Department Not all health departments have doctors that can see patients for sick visits, but many do, so it is worth a call to see if yours does. If you don't know where your local health department is, you can check in your phone book. The CDC also has a tool to help you locate your state's health department, and many state websites also have listings of all of their local health departments.  3) Find a [facility] If your illness is not likely to be very severe or complicated, you may want to try a walk in clinic. These are popping up all over the country in pharmacies, drugstores, and shopping centers. They're usually staffed by nurse practitioners or physician assistants that have been trained to treat common illnesses and complaints. They're usually fairly quick and inexpensive. However, if you have serious medical issues or chronic medical problems, these are probably not your best option.  No Primary Care Doctor: - Call Health Connect at  [phone] - they can help you locate a primary care doctor that  accepts your insurance, provides certain services, etc. - Physician Referral Service- [phone]  Chronic Pain Problems: Organization         Address  Phone   Notes  [facility]  [phone] Patients need to be referred by their primary care doctor.   Medication Assistance: Organization         Address  Phone   Notes  [facility] Medication [facility] [address] [place], [place] [address] [phone] --Must be a resident of [facility] -- Must have NO insurance coverage whatsoever (no Medicaid/ Medicare,  etc.) -- The pt. MUST have a primary care doctor that directs their care regularly and follows them in the community   MedAssist  [phone]   [employer]  [phone]    Agencies that provide inexpensive medical care: Organization         Address  Phone   Notes  [place]  [phone]   [facility]    [phone]   [facility] [place], [place] [address] [phone]   [place] [address], [place] [phone]   Planned Parenthood    [phone]   [facility]    [phone]   [place] and [place] [address], [place] Phone:  [phone], Fax:  [phone] Hours of Operation:  9 am - 6 pm, M-F.  Also accepts Medicaid/Medicare and self-pay.  [facility] for [place], [address], [place] Phone: [phone], Fax: [phone]. Hours of Operation:  8:30 am - 5:30 pm, M-F.  Also accepts Medicaid and self-pay.  [facility] [place] [address] [employer] Phone: [phone]   [place], [place], [place] [phone], Ext. [phone]  Mondays & Thursdays: 7-9 AM.  First 15 patients are seen on a first come, first serve basis.    [place] Providers:  Organization         Address  Phone   Notes  [facility] [address], [place] [phone] Also accepts self-pay patients.  [facility] [address], [place]  [phone]   [place], [address], [place] [phone]   [facility] [address] [place] [phone]   Lucianne Lei [address], [place]   [phone] Only accepts [place] Access [place] patients after they have their name applied to their card.   Self-Pay (no  insurance) in [facility]:  Organization         Address  Phone   Notes  Sickle Cell Patients, [facility] Internal Medicine [place] [phone]   [facility] [place] [phone]   [facility] [place]  [place], [place], [place] [phone]   [facility]/Dr. Osei-Bonsu  [address] [place] or [address], [place] [phone] Phone number for both [place] and [place] locations is the same.  [facility] [address], [place] [phone]   [facility] [address] [place] or [address] [phone]   [facility] [address] [place] [phone], phone; [phone], fax Sees patients 1st and 3rd Saturday of every month.  Must not qualify for public or private insurance (i.e. Medicaid, Medicare, [place] Health Choice, Veterans' Benefits)  Household income should be no more than 200% of the poverty level The clinic cannot treat you if you are pregnant or think you are pregnant  Sexually transmitted diseases are not treated at the clinic.    Dental Care: Organization         Address  Phone  Notes  [facility] of [facility] [place] [phone] Accepts children up to age 7 who are enrolled in [place] or [place]; pregnant women with a Medicaid card; and children who have applied for Medicaid or [place] Health Choice, but were declined, whose parents can pay a reduced fee at time of service.  [facility] Department of [facility]  [address], [place] [phone] Accepts children up to age 37 who are enrolled in [place] or [place]; pregnant women with a Medicaid card; and children who have applied for Medicaid or [place] Health Choice, but were  declined, whose parents can pay a reduced fee at time of service.  [place] Adult Dental Access PROGRAM  [place] [phone] Patients are seen by appointment only. Walk-ins are not accepted. [place] will see patients 28 years of age and older. Monday - Tuesday (8am-5pm) Most Wednesdays (8:30-5pm) $30 per visit, cash only  [facility] Adult Dental Access PROGRAM  [address], [facility] [phone] Patients are seen by appointment only. Walk-ins are not accepted. [place] will see patients 14 years of age and older. One Wednesday Evening (Monthly: Volunteer Based).  $30 per visit, cash only  [place]  [phone] for adults;  Children under age 48, call Graduate Pediatric Dentistry at [phone]. Children aged 74-14, please call [phone] to request a pediatric application.  Dental services are provided in all areas of dental care including fillings, crowns and bridges, complete and partial dentures, implants, gum treatment, root canals, and extractions. Preventive care is also provided. Treatment is provided to both adults and children. Patients are selected via a lottery and there is often a waiting list.   [facility] [address] [place]  [phone] [email or web address]   [facility] [address] [place], [place] [phone], Ext. [phone] Second and Fourth Thursday of each month, opens at 6:30 AM; Clinic ends at 9 AM.  Patients are seen on a first-come first-served basis, and a limited number are seen during each clinic.   [facility]  [address] Hillard Danker [place], [place] [phone]   Eligibility Requirements You must have lived in [place], [place], or [place] counties for at least the last three months.   You cannot be eligible for state or federal sponsored [employer], including [employer], [place], or [employer].   You generally cannot be  eligible for healthcare insurance through your employer.    How to apply: Eligibility screenings are held every Tuesday and Wednesday afternoon from 1:00 pm until 4:00 pm. You do not need an appointment for the interview!  [facility] [address] [place], [place]   [place]  [place] Department  [place]  [phone]    Behavioral Health Resources in the Community: Intensive Outpatient Programs Organization         Address  Phone  Notes  [place]. [address], [place], [place] [phone]   [facility] Outpatient [address], [place], [place]   ADS: Alcohol & Drug Svcs [address] [place], [place]   [place] [address]  [place], [place] or [phone]   Substance Abuse Resources Organization         Address  Phone  Notes  Alcohol and Drug Services  [phone]   [place]  [phone]   [facility] [place]   Chinita Pester  [phone]   Residential & Outpatient Substance Abuse Program  [phone]   Psychological Services Organization         Address  Phone  Notes  [facility] [place]  [place]  [phone]   [place] [address], [place] or [phone]    Mobile Crisis Teams Organization         Address  Phone  Notes  [facility], Mobile Crisis Care Unit  [phone]   [facility]  [address] [place], [place]   [place] [address], [place] [phone]    Self-Help/Support Groups Organization         Address  Phone             Notes  [place]. of [place] - variety of support groups  [place] Call for more information    Narcotics Anonymous (NA), [facility] [address], [employer] [place]  2 meetings at this location   Brewing technologist  Notes  ASAP  Residential Treatment [address]    [place]  [phone]   [facility]  [address], [place] [address]29518, [place], [place]   [place], [place] [phone] Admissions: 8am-3pm M-F  Incentives Substance [place] [address],    [place], [place] [phone]   [facility] [address] [place], [place], [place]   [facility] [address],  [place], [place]   Insight Programs - Intensive Outpatient [address] Kristeen [address], [place], [place] Walk   [facility] [place].,  [place], [place] [phone] or [phone]   [facility] Services (RTS) [address], [place], [place] Accepts Medicaid  [facility] [place] [address],  [place] [phone] Substance Abuse/Addiction Treatment   [facility] Organization         Address  Phone  Notes  [facility]  [phone]   Domenic Schwab, PhD [address] Arlis Port[place], [place]   [phone] or [phone]   [place], [place] [phone]   [facility] [address] [place], [place] [phone] Insurance/Medicaid/sponsorship through [facility] and Families [address] [place]                                    [place], [place] [phone] [place] [address], [place] [phone]    Dr. Adele Schilder  [phone]   [facility] Dept. 1) [address] [place] 2) [place] 3)  [address], [place] [phone]  [phone]   [place] [phone] or [phone] (After Hours)

## 2015-05-02 NOTE — Progress Notes (Signed)
Quick Note:  Please let patient know that potassium is slightly low, needs to increase high potassium foods like bananas, citrus fruits and cantaloupe ______

## 2015-05-03 ENCOUNTER — Telehealth: Payer: Self-pay | Admitting: Endocrinology

## 2015-05-03 NOTE — Telephone Encounter (Signed)
I did not understand the question, she was given prescription and instructions for hydrocortisone on her visit to replace the dexamethasone

## 2015-05-03 NOTE — Telephone Encounter (Signed)
When patient came to see you, she was having some type of symptoms, she states you put her on the Hydrocortisone, but since she saw you those symptoms have stopped, she wants to know if it's okay for her not to take the Hydrocortisone? Please advise

## 2015-05-03 NOTE — Telephone Encounter (Signed)
Noted, patient is aware. 

## 2015-05-03 NOTE — Telephone Encounter (Signed)
FYI  Please see below

## 2015-05-03 NOTE — Telephone Encounter (Signed)
She needs to continue her hydrocortisone as directed until I see her back. She can take 20 mg in the morning and half tablet in the evening

## 2015-05-03 NOTE — Telephone Encounter (Signed)
Patient stated that she is on medication she do not need to take, Hydrocortisone, please advise

## 2015-05-03 NOTE — Telephone Encounter (Signed)
Please see below.

## 2015-05-03 NOTE — Telephone Encounter (Signed)
[facility] note dated 05/03/15 @ 4:23  Relationship To Patient Self Return Phone Number [phone] (Primary) Chief Complaint Headache Initial Comment Migraine and taking medicine, wants to know what to take for headache PreDisposition Call Doctor Nurse Assessment Nurse: Ronnie Derby, RN, Estill Bamberg Date/Time Eilene Ghazi Time): 05/02/2015 4:23:24 PM Confirm and document reason for call. If symptomatic, describe symptoms. ---Caller states she has a headache since last night. No fever. Her pain is currently 4/10. She has pituitary gland problems. Has the patient traveled out of the country within the last 30 days? ---No Does the patient require triage? ---Yes Related visit to physician within the last 2 weeks? ---Yes Does the PT have any chronic conditions? (i.e. 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 ---No Guidelines Guideline Title Affirmed Question Affirmed Notes Nurse Date/Time (Eastern Time) Headache Similar to previously diagnosed migraine headaches (all triage questions negative) Ronnie Derby, RN, Estill Bamberg 05/02/2015 4:25:29 PM Disp. Time Eilene Ghazi Time) Disposition Final User 05/02/2015 4:29:48 PM Home Care Yes Omer, RN, Shelly Coss Understands: Yes Disagree/Comply: Comply Care Advice Given Per Guideline PLEASE NOTE: All timestamps contained within this report are represented as [place] Standard Time. CONFIDENTIALTY NOTICE: This fax transmission is intended only for the addressee. It contains information that is legally privileged, confidential or otherwise protected from use or disclosure. If you are not the intended recipient, you are strictly prohibited from reviewing, disclosing, copying using or disseminating any of this information or taking any action in reliance on or regarding this information. If you have received this fax in error, please notify us immediately by telephone so that we can  arrange for its return to [place]. Phone: [phone], Toll-Free: [phone], Fax: [phone] Page: 2 of 2 Call Id: [id number] Care Advice Given Per Guideline HOME CARE: You should be able to treat this at home. REASSURANCE - MIGRAINE HEADACHE: * You have told me that this headache is similar to previous migraine headaches that you have had. If the pattern or severity of your headache changes, you will need to see your physician. MIGRAINE MEDICATION: If your doctor has prescribed you specific medication for your migraine, take it as directed as soon as the migraine starts. ACETAMINOPHEN (E.G., TYLENOL): * Take 650 mg (two 325 mg pills) by mouth every 4-6 hours as needed. Each Regular Strength Tylenol pill has 325 mg of acetaminophen. The most you should take each day is 3,250 mg (10 Regular Strength pills a day). * Another choice is to take 1,000 mg (two 500 mg pills) every 8 hours as needed. Each Extra Strength Tylenol pill has 500 mg of acetaminophen. The most you should take each day is 3,000 mg (6 Extra Strength pills a day). LOCAL COLD: Apply a cold wet washcloth or cold pack to the forehead for 20 minutes REST: * Lie down in a dark quiet place and relax until feeling better. CALL BACK IF: * Severe headache persists over 2 hours after pain medicine * Headache lasts over 72 hours * Stiff neck occurs (i.e., can't touch chin to chest) * You become worse. CARE ADVICE given per Headache (Adult) guideline. After Care Instructions Given Call Event Type User Date / Time Description Comments User: Rudean Haskell, RN Date/Time Eilene Ghazi Time): 05/02/2015 4:31:37 PM The nurse advised on Tylenol rather than ibuprofen since she is currently taking Lopressor, and Hydrocortisone per medscape. http:// reference.ShareRepair.nl Patient Regimen metoprolol hydrocortisone acetaminophen  Tylenol Significant - Monitor Closely ibuprofen + hydrocortisone ibuprofen, hydrocortisone. Either increases  toxicity of the other by pharmacodynamic synergism. Use Caution/Monitor. Increased risk of GI ulceration. ibuprofen + metoprolol ibuprofen decreases effects of metoprolol by pharmacodynamic antagonism. Use Caution/Monitor. Long term (>1 wk) NSAID use. NSAIDs decrease prostaglandin synthesis. metoprolol + ibuprofen metoprolol and ibuprofen both increase serum potassium. Use Caution/Monitor.

## 2015-05-05 ENCOUNTER — Encounter: Payer: Self-pay | Admitting: Physician Assistant

## 2015-05-05 ENCOUNTER — Telehealth: Payer: Self-pay | Admitting: Endocrinology

## 2015-05-05 ENCOUNTER — Ambulatory Visit (INDEPENDENT_AMBULATORY_CARE_PROVIDER_SITE_OTHER): Payer: Medicaid Other | Admitting: Physician Assistant

## 2015-05-05 VITALS — BP 112/84 | HR 100 | Ht 62.0 in | Wt 164.0 lb

## 2015-05-05 DIAGNOSIS — Z8639 Personal history of other endocrine, nutritional and metabolic disease: Secondary | ICD-10-CM

## 2015-05-05 DIAGNOSIS — R002 Palpitations: Secondary | ICD-10-CM | POA: Diagnosis not present

## 2015-05-05 DIAGNOSIS — Z87898 Personal history of other specified conditions: Secondary | ICD-10-CM

## 2015-05-05 DIAGNOSIS — R0602 Shortness of breath: Secondary | ICD-10-CM

## 2015-05-05 DIAGNOSIS — E876 Hypokalemia: Secondary | ICD-10-CM | POA: Diagnosis not present

## 2015-05-05 DIAGNOSIS — R42 Dizziness and giddiness: Secondary | ICD-10-CM

## 2015-05-05 MED ORDER — PROPRANOLOL HCL ER 60 MG PO CP24: 60.0000 mg | ORAL_CAPSULE | Freq: Every day | ORAL | Status: DC

## 2015-05-05 NOTE — Telephone Encounter (Signed)
Noted patient is aware 

## 2015-05-05 NOTE — Telephone Encounter (Signed)
Please see below and advise.

## 2015-05-05 NOTE — Telephone Encounter (Signed)
No.  She needs to talk to her neurosurgeon and ophthalmologist

## 2015-05-05 NOTE — Progress Notes (Signed)
Cardiology Office Note Date:  05/05/2015  Patient ID:  Gloria Adams, DOB [birth year], MRN [id number] PCP:  No PCP Per Patient  Cardiologist:  New to Dr. Burt Knack   Chief Complaint: palpitations  History of Present Illness: Gloria Adams is a 24 y.o. female with history of pituitary macroadenoma (s/p transsphenoidal resection, post-op course remarkable for diabetes insipidus), elevated BP towards end of prior pregnancy, and no prior cardiac history who presents for evaluation of palpitations.   She had resection of her pituitary macroadenoma on 03/30/15 and feels that ever since that time, she's had increased awareness of elevated HR associated with SOB. Initially this was only after she would eat and when she took her medication. She was seen by endocrinology on 04/30/15 at which time she reported these symptoms. She was started on metoprolol ER. Labs revealed hypokalemia with K of 3.4 on 04/30/15 and she was instructed to eat more potassium-rich foods. She was started on hydrocortisone. Free T4 was normal. She presented to the ER the following day for similar symptoms (palpitations and dizziness) with f/u K 3.8, WBC 11.3 (downtrending leukocytosis from prior), urine hCG negative. She feels that ever since starting the metoprolol she actually feels worse - symptoms are now essentially all the time, rather than just after eating. She also feels more fatigued. In clinic today she was feeling these symptoms during EKG which showed NSR/borderline sinus tach, HR 96bpm. Orthostatics in office today showed Lying HR 100 and BP 112/84, Sitting HR 93 and BP 113/79, Standing 64min HR 99 and BP 116/82, and Standing 80min HR 106, BP 118/78. She has not had any caffeine for the last week. She denies any tobacco, alcohol or drug use. She endorses occasional sense of chest discomfort but this is only intermittently after she eats and relieved with burping. No exertional chest pain. No syncope. Family history notable for  father having died of a possible bood clot to his heart (?MI) in his 21s. No LEE, LE erythema, or orthopnea. Pulse ox 99% and maintained with ambulation on room air.   Past Medical History  Diagnosis Date  . Pregnancy induced hypertension     end of preg  . Bartholin cyst   . Pituitary macroadenoma   . Diabetes insipidus     a. after resection for pituitary macroadenoma.    Past Surgical History  Procedure Laterality Date  . Craniotomy N/A 03/30/2015    Procedure: CRANIOTOMY HYPOPHYSECTOMY TRANSNASAL APPROACH;  Surgeon: Karie Chimera, MD;  Location: [facility];  Service: Neurosurgery;  Laterality: N/A;  . Transnasal approach N/A 03/30/2015    Procedure: TRANSNASAL APPROACH;  Surgeon: Rozetta Nunnery, MD;  Location: [facility];  Service: ENT;  Laterality: N/A;  . Transphenoidal / transnasal hypophysectomy / resection pituitary tumor      Current Outpatient Prescriptions  Medication Sig Dispense Refill  . desmopressin (DDAVP) 0.1 MG tablet Take 1 tablet (0.1 mg total) by mouth at bedtime. (Patient taking differently: Take 0.1 mg by mouth at bedtime as needed (flare). ) 30 tablet 0  . EPINEPHrine 0.3 mg/0.3 mL IJ SOAJ injection Inject 0.3 mLs (0.3 mg total) into the muscle once. 1 Device 0  . hydrocortisone (CORTEF) 20 MG tablet Take 2 tablets in the morning and 1 at 5 PM for the next 3 days and then one tablet in the morning and half tablet in the evening 60 tablet 1  . metoprolol succinate (TOPROL-XL) 50 MG 24 hr tablet Take 1 tablet (50 mg total) by mouth  daily. Take with or immediately following a meal. 30 tablet 0  . [DISCONTINUED] etonogestrel-ethinyl estradiol (NUVARING) 0.12-0.015 MG/24HR vaginal ring Insert vaginally and leave in place for 3 consecutive weeks, then remove for 1 week. (Patient not taking: Reported on 02/26/2015) 1 each 12   No current facility-administered medications for this visit.    Allergies:   Review of patient's allergies indicates no known  allergies.   Social History:  The patient  reports that she has never smoked. She has never used smokeless tobacco. She reports that she drinks alcohol. She reports that she does not use illicit drugs.   Family History:  The patient's family history includes Diabetes in her maternal grandmother and paternal grandmother; Hypertension in her father. There is no history of Heart attack or Stroke.   ROS:  Please see the history of present illness. All other systems are reviewed and otherwise negative.   PHYSICAL EXAM:  VS:  BP 112/84 mmHg  Pulse 100  Ht 5\' 2"  (1.575 m)  Wt 164 lb (74.39 kg)  BMI 29.99 kg/m2  SpO2 98%  LMP 04/23/2015 (Approximate) BMI: Body mass index is 29.99 kg/(m^2). Well nourished, well developed AAF in no acute distress HEENT: normocephalic, atraumatic Neck: no JVD, carotid bruits or masses Cardiac:  normal S1, more pronounced S2; reg rhythm with slightly elevated rate; no murmurs, rubs, or gallops Lungs:  clear to auscultation bilaterally, no wheezing, rhonchi or rales Abd: soft, nontender, no hepatomegaly, + BS MS: no deformity or atrophy Ext: no edema Skin: warm and dry, no rash Neuro:  moves all extremities spontaneously, no focal abnormalities noted, follows commands Psych: euthymic mood, full affect   EKG:  Done today shows NSR 96bpm, possible left atrial enlargement, TWI III, V2-V5 - appears similar compared to prior  Recent Labs: 04/30/2015: ALT 23 05/01/2015: BUN 16; Creatinine, Ser 0.55; Hemoglobin 13.0; Platelets 222; Potassium 3.8; Sodium 137  No results found for requested labs within last 365 days.   Estimated Creatinine Clearance: 104.1 mL/min (by C-G formula based on Cr of 0.55).   Wt Readings from Last 3 Encounters:  05/05/15 164 lb (74.39 kg)  04/30/15 164 lb (74.39 kg)  04/07/15 160 lb (72.576 kg)     Other studies reviewed: Additional studies/records reviewed today include: summarized above  ASSESSMENT AND PLAN:  Since the patient  was new, she was seen and examined by myself and Dr. Burt Knack and the plan was formulated together.  1. Recent palpitations/SOB/fatigue - suspect symptoms are due to sinus tachycardia which may have been precipitated by recent hormonal changes related to pituitary surgery rather than an underlying primary cardiac issue. She had recent normal free T4 and Hgb. She is not tachypneic or hypoxic, and has no signs of DVT on exam. Per discussion with MD, will try her on a different beta blocker - will d/c metoprolol and try propranolol ER 60mg  daily instead. Dr. Burt Knack told the patient if she does not feel better on this, she can stop it in a week. We will also check echocardiogram given abnormal EKG and symptoms to exclude structural heart disease. TW changes on EKG may be physiologic given her age. 2. History of pituitary tumor s/p resection, complicated by diabetes insipidus - followed by endocrinology. 3. Recent hypokalemia - improved by f/u labs in ER.  Disposition: Will refer to EP to see in follow-up for symptomatic sinus tachycardia - f/u 1-2 months.  Current medicines are reviewed at length with the patient today.  The patient did not have  any concerns regarding medicines.  Raechel Ache PA-C 05/05/2015 11:40 AM     [facility] [place] [address] [phone] (office)  [phone] (fax)

## 2015-05-05 NOTE — Patient Instructions (Addendum)
Medication Instructions:  Your physician has recommended you make the following change in your medication:  1.  DISCONTINUE Toprol 2.  START Propranolol 60 mg taking 1 tablet a day    Labwork: None ordered  Testing/Procedures: Your physician has requested that you have an echocardiogram. Echocardiography is a painless test that uses sound waves to create images of your heart. It provides your doctor with information about the size and shape of your heart and how well your heart's chambers and valves are working. This procedure takes approximately one hour. There are no restrictions for this procedure.    Follow-Up: Your physician recommends that you schedule a follow-up appointment in 1-2 MONTHS FOR A NEW PATIENT APPOINTMENT WITH A EP Zvi Duplantis FOR INAPPROPRIATE SINUS TACHYCARDIA.    Any Other Special Instructions Will Be Listed Below (If Applicable).    Echocardiogram An echocardiogram, or echocardiography, uses sound waves (ultrasound) to produce an image of your heart. The echocardiogram is simple, painless, obtained within a short period of time, and offers valuable information to your health care Gloria Adams. The images from an echocardiogram can provide information such as:  Evidence of coronary artery disease (CAD).  Heart size.  Heart muscle function.  Heart valve function.  Aneurysm detection.  Evidence of a past heart attack.  Fluid buildup around the heart.  Heart muscle thickening.  Assess heart valve function. LET [facility] CARE Gloria Adams KNOW ABOUT:  Any allergies you have.  All medicines you are taking, including vitamins, herbs, eye drops, creams, and over-the-counter medicines.  Previous problems you or members of your family have had with the use of anesthetics.  Any blood disorders you have.  Previous surgeries you have had.  Medical conditions you have.  Possibility of pregnancy, if this applies. BEFORE THE PROCEDURE  No special preparation  is needed. Eat and drink normally.  PROCEDURE   In order to produce an image of your heart, gel will be applied to your chest and a wand-like tool (transducer) will be moved over your chest. The gel will help transmit the sound waves from the transducer. The sound waves will harmlessly bounce off your heart to allow the heart images to be captured in real-time motion. These images will then be recorded.  You may need an IV to receive a medicine that improves the quality of the pictures. AFTER THE PROCEDURE You may return to your normal schedule including diet, activities, and medicines, unless your health care Gloria Adams tells you otherwise. Document Released: 07/21/2000 Document Revised: 12/08/2013 Document Reviewed: 03/31/2013 [facility] Patient Information 2015 [place], [place]. This information is not intended to replace advice given to you by your health care Gloria Adams. Make sure you discuss any questions you have with your health care Gloria Adams.

## 2015-05-05 NOTE — Telephone Encounter (Signed)
Pt has started to experience vision changes since the start of the new med 3 days ago. Could it be the hydrocortisone we rx for her?

## 2015-05-19 ENCOUNTER — Other Ambulatory Visit: Payer: Self-pay

## 2015-05-19 ENCOUNTER — Ambulatory Visit (HOSPITAL_COMMUNITY): Payer: Medicaid Other | Attending: Cardiovascular Disease

## 2015-05-19 DIAGNOSIS — I340 Nonrheumatic mitral (valve) insufficiency: Secondary | ICD-10-CM | POA: Insufficient documentation

## 2015-05-19 DIAGNOSIS — R0602 Shortness of breath: Secondary | ICD-10-CM | POA: Diagnosis not present

## 2015-05-19 DIAGNOSIS — R002 Palpitations: Secondary | ICD-10-CM | POA: Insufficient documentation

## 2015-05-20 ENCOUNTER — Telehealth: Payer: Self-pay | Admitting: *Deleted

## 2015-05-20 ENCOUNTER — Other Ambulatory Visit (INDEPENDENT_AMBULATORY_CARE_PROVIDER_SITE_OTHER): Payer: Medicaid Other

## 2015-05-20 ENCOUNTER — Other Ambulatory Visit: Payer: Self-pay | Admitting: *Deleted

## 2015-05-20 DIAGNOSIS — D352 Benign neoplasm of pituitary gland: Secondary | ICD-10-CM | POA: Diagnosis not present

## 2015-05-20 LAB — COMPREHENSIVE METABOLIC PANEL
ALT: 21 U/L (ref 0–35)
AST: 15 U/L (ref 0–37)
Albumin: 3.9 g/dL (ref 3.5–5.2)
Alkaline Phosphatase: 61 U/L (ref 39–117)
BUN: 9 mg/dL (ref 6–23)
CHLORIDE: 102 meq/L (ref 96–112)
CO2: 29 mEq/L (ref 19–32)
Calcium: 9.3 mg/dL (ref 8.4–10.5)
Creatinine, Ser: 0.55 mg/dL (ref 0.40–1.20)
GFR: 177.35 mL/min (ref >60.00)
GLUCOSE: 155 mg/dL — AB (ref 70–99)
POTASSIUM: 3.8 meq/L (ref 3.5–5.1)
SODIUM: 140 meq/L (ref 135–145)
TOTAL PROTEIN: 6.9 g/dL (ref 6.0–8.3)
Total Bilirubin: 0.2 mg/dL (ref 0.2–1.2)

## 2015-05-20 LAB — URINALYSIS
Bilirubin Urine: NEGATIVE
Ketones, ur: NEGATIVE
Leukocytes, UA: NEGATIVE
NITRITE: NEGATIVE
PH: 5.5 (ref 5.0–8.0)
Specific Gravity, Urine: >=1.03 — AB (ref 1.000–1.030)
TOTAL PROTEIN, URINE-UPE24: NEGATIVE
Urine Glucose: NEGATIVE
Urobilinogen, UA: 0.2 (ref 0.0–1.0)

## 2015-05-20 LAB — T4, FREE: Free T4: 0.99 ng/dL (ref 0.60–1.60)

## 2015-05-20 NOTE — Telephone Encounter (Signed)
-----   Message from Eileen Stanford, PA-C sent at 05/20/2015  1:29 PM EDT ----- Can you let her know that ECHO was normal ? THanks

## 2015-05-20 NOTE — Telephone Encounter (Signed)
Called pt and let her know that her ECHO was normal.  She was advised to continue on the Propanolol until she sees the EP specialist and if they wanted to change anything, it will be done at that visit.  PT verbalized understanding

## 2015-05-25 ENCOUNTER — Ambulatory Visit (INDEPENDENT_AMBULATORY_CARE_PROVIDER_SITE_OTHER): Payer: Medicaid Other | Admitting: Endocrinology

## 2015-05-25 ENCOUNTER — Encounter: Payer: Self-pay | Admitting: Endocrinology

## 2015-05-25 VITALS — BP 118/74 | HR 88 | Temp 97.8°F | Resp 14 | Ht 62.0 in | Wt 169.2 lb

## 2015-05-25 DIAGNOSIS — D352 Benign neoplasm of pituitary gland: Secondary | ICD-10-CM | POA: Diagnosis not present

## 2015-05-25 NOTE — Progress Notes (Signed)
Patient ID: Gloria Adams, female   DOB: Jan 28, 1991, 24 y.o.   MRN: [id number]           Chief complaint:  Follow-up of pituitary issues  PITUITARY tumor follow-up:  Patient had presented to the optician with history of blurred vision and subsequently was seen by neurologist who found her pituitary lesion on MRI scan. She had surgery for a sellar and suprasellar cystic lesion on 03/30/15  Postoperatively the patient  developed diabetes insipidus. She was discharged on 0.1 mg twice a day of the DDAVP. On her own she stopped taking this medication  This was not resumed on her follow-up visit   Recently she has only the get up once at night.  No frequency during the day  Her  Urine specific gravity and sodium was normal.  ENDOCRINE  function:  She had menstrual cycles after her pregnancy about a year ago and had stopped lactating   She says recently her menstrual cycles have been normal.  She does not complain of any fatigue and previously had felt very anxious also  She was switched from high dose dexamethasone to hydrocortisone and she is doing well with this Currently on physiological doses of hydrocortisone 30 mg a day. Her glucose appears to be higher at 155   Her free T4 was also normal  Lab Results  Component Value Date   CREATININE 0.55 05/20/2015   BUN 9 05/20/2015   NA 140 05/20/2015   K 3.8 05/20/2015   CL 102 05/20/2015   CO2 29 05/20/2015      History of Present Illness:    Medication List       This list is accurate as of: 05/25/15 11:17 AM.  Always use your most recent med list.               desmopressin 0.1 MG tablet  Commonly known as:  DDAVP  Take 1 tablet (0.1 mg total) by mouth at bedtime.     EPINEPHrine 0.3 mg/0.3 mL Soaj injection  Commonly known as:  EPI-PEN  Inject 0.3 mLs (0.3 mg total) into the muscle once.     hydrocortisone 20 MG tablet  Commonly known as:  CORTEF  Take 2 tablets in the morning and 1 at 5 PM for the next 3 days  and then one tablet in the morning and half tablet in the evening     propranolol ER 60 MG 24 hr capsule  Commonly known as:  INDERAL LA  Take 1 capsule (60 mg total) by mouth daily.        Allergies: No Known Allergies  Past Medical History  Diagnosis Date  . Pregnancy induced hypertension     end of preg  . Bartholin cyst   . Pituitary macroadenoma ([place])   . Diabetes insipidus ([place])     a. after resection for pituitary macroadenoma.    Past Surgical History  Procedure Laterality Date  . Craniotomy N/A 03/30/2015    Procedure: CRANIOTOMY HYPOPHYSECTOMY TRANSNASAL APPROACH;  Surgeon: Karie Chimera, MD;  Location: [place] [facility];  Service: Neurosurgery;  Laterality: N/A;  . Transnasal approach N/A 03/30/2015    Procedure: TRANSNASAL APPROACH;  Surgeon: Rozetta Nunnery, MD;  Location: [facility];  Service: ENT;  Laterality: N/A;  . Transphenoidal / transnasal hypophysectomy / resection pituitary tumor      Family History  Problem Relation Age of Onset  . Hypertension Father   . Diabetes Maternal Grandmother   . Diabetes Paternal Grandmother   .  Stroke Neg Hx   . Heart disease Father     Died of blood clot to his heart in his 46s?    Social History:  reports that she has never smoked. She has never used smokeless tobacco. She reports that she does not drink alcohol or use illicit drugs.  Review of Systems   ROS   she was seen by the cardiologist for palpitations and sinus tachycardia and was switched from metoprolol to propranolol which she is continuing and she will be going back for follow-up  EXAM:  BP 118/74 mmHg  Pulse 88  Temp(Src) 97.8 F (36.6 C)  Resp 14  Ht 5\' 2"  (1.575 m)  Wt 169 lb 3.2 oz (76.749 kg)  BMI 30.94 kg/m2  SpO2 99%  LMP 04/23/2015 (Approximate)  Physical Exam    mucous membranes are moist  She looks well  Assessment/Plan:   PITUITARY tumor:  She has normal pituitary function clinically with normal menstrual cycles and  normal free T4 level consistently now  Cortisone supplementation:   Most likely since she appears to have normal pituitary function otherwise will not need to continue this.  Will taper this off for the next 2 weeks and have her follow-up in one month.  Tapering schedule given. Have advised her to call if she has any addisonian symptoms  DIABETES insipidus:   This is resolved  Patient Instructions  Hydrocortisone 1/2 twice daily for 5 days.  Then sop pm dose 5 days later stop am dose  Call if nauseous, weak or dizzy      Auther Lyerly 05/25/2015, 11:17 AM

## 2015-05-25 NOTE — Patient Instructions (Signed)
Hydrocortisone 1/2 twice daily for 5 days.  Then sop pm dose 5 days later stop am dose  Call if nauseous, weak or dizzy

## 2015-06-10 ENCOUNTER — Ambulatory Visit (INDEPENDENT_AMBULATORY_CARE_PROVIDER_SITE_OTHER): Payer: Medicaid Other | Admitting: Cardiology

## 2015-06-10 ENCOUNTER — Encounter: Payer: Self-pay | Admitting: Cardiology

## 2015-06-10 VITALS — BP 110/76 | HR 90 | Ht 62.0 in | Wt 170.4 lb

## 2015-06-10 DIAGNOSIS — R002 Palpitations: Secondary | ICD-10-CM

## 2015-06-10 NOTE — Patient Instructions (Signed)
Medication Instructions:  Your physician recommends that you continue on your current medications as directed. Please refer to the Current Medication list given to you today.  Labwork: None ordered  Testing/Procedures: None ordered  Follow-Up: Your physician wants you to follow-up in: 6 months with Dr. Curt Bears. You will receive a reminder letter in the mail two months in advance. If you don't receive a letter, please call our office to schedule the follow-up appointment.   Any Other Special Instructions Will Be Listed Below (If Applicable).  If you need a refill on your cardiac medications before your next appointment, please call your pharmacy.  Thank you for choosing [facility]!!   Trinidad Curet, RN [phone]

## 2015-06-10 NOTE — Progress Notes (Addendum)
Electrophysiology Office Note   Date:  06/10/2015   ID:  Gloria Adams, DOB 03-21-1991, MRN [id number]  PCP:  No PCP Per Patient   Primary Electrophysiologist:  Zhanna Melin Meredith Leeds, Gloria Adams    Chief Complaint  Patient presents with  . Tachycardia     History of Present Illness: Gloria Adams is a 24 y.o. female who presents today for electrophysiology evaluation.   She has a history of pituitary macroadenoma s/p resection, diabetes insipidus, and pregnancy induced hypertension.    She had resection of her pituitary macroadenoma on 03/30/15 and feels that ever since that time, she's had increased awareness of elevated HR associated with SOB. Initially this was only after she would eat and when she took her medication. She was seen by endocrinology on 04/30/15 at which time she reported these symptoms. She was started on metoprolol ER.  She feels that ever since starting the metoprolol she actually feels worse - symptoms are now essentially all the time, rather than just after eating.  She also feels more fatigued.  She has been switched from metoprolol to propranolol which has made her feel better.  She does continue to have minor palpitations on the propranolol.  Today, she denies symptoms of shortness of breath, orthopnea, PND, lower extremity edema, claudication, dizziness, presyncope, syncope, bleeding, or neurologic sequela. The patient is tolerating medications without difficulties and is otherwise without complaint today.    Past Medical History  Diagnosis Date  . Pregnancy induced hypertension     end of preg  . Bartholin cyst   . Pituitary macroadenoma ([place])   . Diabetes insipidus ([place])     a. after resection for pituitary macroadenoma.   Past Surgical History  Procedure Laterality Date  . Craniotomy N/A 03/30/2015    Procedure: CRANIOTOMY HYPOPHYSECTOMY TRANSNASAL APPROACH;  Surgeon: Karie Chimera, Gloria Adams;  Location: [place] [facility];  Service: Neurosurgery;  Laterality: N/A;  .  Transnasal approach N/A 03/30/2015    Procedure: TRANSNASAL APPROACH;  Surgeon: Rozetta Nunnery, Gloria Adams;  Location: [facility];  Service: ENT;  Laterality: N/A;  . Transphenoidal / transnasal hypophysectomy / resection pituitary tumor       Current Outpatient Prescriptions  Medication Sig Dispense Refill  . EPINEPHrine 0.3 mg/0.3 mL IJ SOAJ injection Inject 0.3 mLs (0.3 mg total) into the muscle once. 1 Device 0  . propranolol ER (INDERAL LA) 60 MG 24 hr capsule Take 1 capsule (60 mg total) by mouth daily. 90 capsule 0  . [DISCONTINUED] etonogestrel-ethinyl estradiol (NUVARING) 0.12-0.015 MG/24HR vaginal ring Insert vaginally and leave in place for 3 consecutive weeks, then remove for 1 week. (Patient not taking: Reported on 02/26/2015) 1 each 12   No current facility-administered medications for this visit.    Allergies:   Review of patient's allergies indicates no known allergies.   Social History:  The patient  reports that she has never smoked. She has never used smokeless tobacco. She reports that she does not drink alcohol or use illicit drugs.   Family History:  The patient's family history includes Diabetes in her maternal grandmother and paternal grandmother; Heart disease in her father; Hypertension in her father. There is no history of Stroke.    ROS:  Please see the history of present illness.   Otherwise, review of systems is positive for cough, shortness of breath, fatigue.   All other systems are reviewed and negative.    PHYSICAL EXAM: VS:  BP 110/76 mmHg  Pulse 90  Ht 5\' 2"  (1.575  m)  Wt 170 lb 6.4 oz (77.293 kg)  BMI 31.16 kg/m2 , BMI Body mass index is 31.16 kg/(m^2). GEN: Well nourished, well developed, in no acute distress HEENT: normal Neck: no JVD, carotid bruits, or masses Cardiac: RRR; no murmurs, rubs, or gallops,no edema  Respiratory:  clear to auscultation bilaterally, normal work of breathing GI: soft, nontender, nondistended, + BS MS: no deformity  or atrophy Skin: warm and dry Neuro:  Strength and sensation are intact Psych: euthymic mood, full affect  EKG:  EKG is ordered today. The ekg ordered today shows sinus rhythm, rate 90, anterior TWI, possibly persistent juvenile pattern  Recent Labs: 05/01/2015: Hemoglobin 13.0; Platelets 222 05/20/2015: ALT 21; BUN 9; Creatinine, Ser 0.55; Potassium 3.8; Sodium 140    Lipid Panel  No results found for: CHOL, TRIG, HDL, CHOLHDL, VLDL, LDLCALC, LDLDIRECT   Wt Readings from Last 3 Encounters:  06/10/15 170 lb 6.4 oz (77.293 kg)  05/25/15 169 lb 3.2 oz (76.749 kg)  05/05/15 164 lb (74.39 kg)      Other studies Reviewed: Additional studies/ records that were reviewed today include: TTE 05/19/15  Review of the above records today demonstrates:   - Left ventricle: The cavity size was normal. Systolic function wasnormal. The estimated ejection fraction was in the range of 55%to 60%. Wall motion was normal; there were no regional wallmotion abnormalities. Left ventricular diastolic functionparameters were normal.    ASSESSMENT AND PLAN:  1. Recent palpitations/SOB/fatigue - Was started on propranolol at her last clinic visit with stopping metoprolol.  Also had TTE showing no structural heart disease.  Has had a free T4 which was normal.  She had symptoms that were worse on metoprolol but have improved with propanolol.  She wishes to try without propranolol for one week to see if she conitnues to have worsening symptoms.  If she does, [place] plan to restart.   2. History of pituitary tumor s/p resection, complicated by diabetes insipidus - followed by endocrinology. 3. Recent hypokalemia - improved by f/u labs in ER.   Current medicines are reviewed at length with the patient today.   The patient has concerns regarding her medicines.  The following changes were made today:  Hold for one week, if symptoms, restart propranolol  Labs/ tests ordered today include: none  Orders  Placed This Encounter  Procedures  . EKG 12-Lead     Disposition:   FU with Jonerik Sliker 6 months  Signed, Katriana Dortch Meredith Leeds, Gloria Adams  06/10/2015 2:53 PM     [facility] [address] [place] [address] [phone] (office) [phone] (fax)

## 2015-06-21 ENCOUNTER — Ambulatory Visit: Payer: Medicaid Other | Admitting: Endocrinology

## 2015-06-24 ENCOUNTER — Other Ambulatory Visit: Payer: Medicaid Other

## 2015-07-08 ENCOUNTER — Other Ambulatory Visit: Payer: Self-pay | Admitting: *Deleted

## 2015-07-08 ENCOUNTER — Other Ambulatory Visit (INDEPENDENT_AMBULATORY_CARE_PROVIDER_SITE_OTHER): Payer: Medicaid Other

## 2015-07-08 DIAGNOSIS — D352 Benign neoplasm of pituitary gland: Secondary | ICD-10-CM | POA: Diagnosis not present

## 2015-07-08 LAB — BASIC METABOLIC PANEL
BUN: 7 mg/dL (ref 6–23)
CHLORIDE: 104 meq/L (ref 96–112)
CO2: 28 mEq/L (ref 19–32)
Calcium: 9.5 mg/dL (ref 8.4–10.5)
Creatinine, Ser: 0.65 mg/dL (ref 0.40–1.20)
GFR: 146.07 mL/min (ref >60.00)
GLUCOSE: 93 mg/dL (ref 70–99)
POTASSIUM: 4.4 meq/L (ref 3.5–5.1)
SODIUM: 137 meq/L (ref 135–145)

## 2015-07-08 NOTE — Addendum Note (Signed)
Addended by: Kaylyn Lim I on: 07/08/2015 01:18 PM   Modules accepted: Orders

## 2015-07-09 LAB — PROLACTIN: Prolactin: 22.4 ng/mL (ref 4.8–23.3)

## 2015-07-14 ENCOUNTER — Ambulatory Visit: Payer: Medicaid Other | Admitting: Endocrinology

## 2015-07-21 ENCOUNTER — Ambulatory Visit: Payer: Medicaid Other | Admitting: Endocrinology

## 2015-07-28 ENCOUNTER — Ambulatory Visit (INDEPENDENT_AMBULATORY_CARE_PROVIDER_SITE_OTHER): Payer: Medicaid Other | Admitting: Endocrinology

## 2015-07-28 ENCOUNTER — Encounter: Payer: Self-pay | Admitting: Endocrinology

## 2015-07-28 VITALS — BP 110/64 | HR 86 | Temp 97.8°F | Resp 14 | Ht 62.0 in | Wt 169.4 lb

## 2015-07-28 DIAGNOSIS — D352 Benign neoplasm of pituitary gland: Secondary | ICD-10-CM

## 2015-07-28 NOTE — Progress Notes (Signed)
Patient ID: Gloria Adams, female   DOB: [birth year], 24 y.o.   MRN: [id number]           Chief complaint:  Follow-up of pituitary adenoma  PITUITARY tumor follow-up:  Patient had presented to the optician with history of blurred vision and subsequently was seen by neurologist who found her pituitary lesion on MRI scan. She had surgery for a sellar and suprasellar cystic lesion on 03/30/15 Last MRI showed resection of the tumor  Postoperatively the patient  developed diabetes insipidus. She was discharged on 0.1 mg twice a day of the DDAVP. On her own she stopped taking this medication  This was not resumed on her follow-up visit She has not had any increased urination or nocturia. Electrolytes are normal  ENDOCRINE  function:  She had menstrual cycles after her pregnancy about a year ago and had stopped lactating   She says now her menstrual cycles have been normal. Her prolactin was minimally increased previously but it is normal now  She does not complain of any fatigue Her hydrocortisone was tapered off on her last visit and she feels fairly good without any weakness, nausea or change in appetite   Her free T4 was also normal previously  Lab Results  Component Value Date   CREATININE 0.65 07/08/2015   BUN 7 07/08/2015   NA 137 07/08/2015   K 4.4 07/08/2015   CL 104 07/08/2015   CO2 28 07/08/2015    Lab Results  Component Value Date   FREET4 0.99 05/20/2015   FREET4 0.73 04/30/2015     History of Present Illness:    Medication List       This list is accurate as of: 07/28/15 11:57 AM.  Always use your most recent med list.               EPINEPHrine 0.3 mg/0.3 mL Soaj injection  Commonly known as:  EPI-PEN  Inject 0.3 mLs (0.3 mg total) into the muscle once.        Allergies: No Known Allergies  Past Medical History  Diagnosis Date  . Pregnancy induced hypertension     end of preg  . Bartholin cyst   . Pituitary macroadenoma ([place])   . Diabetes  insipidus ([place])     a. after resection for pituitary macroadenoma.    Past Surgical History  Procedure Laterality Date  . Craniotomy N/A 03/30/2015    Procedure: CRANIOTOMY HYPOPHYSECTOMY TRANSNASAL APPROACH;  Surgeon: Karie Chimera, MD;  Location: [place] [facility];  Service: Neurosurgery;  Laterality: N/A;  . Transnasal approach N/A 03/30/2015    Procedure: TRANSNASAL APPROACH;  Surgeon: Rozetta Nunnery, MD;  Location: [facility];  Service: ENT;  Laterality: N/A;  . Transphenoidal / transnasal hypophysectomy / resection pituitary tumor      Family History  Problem Relation Age of Onset  . Hypertension Father   . Diabetes Maternal Grandmother   . Diabetes Paternal Grandmother   . Stroke Neg Hx   . Heart disease Father     Died of blood clot to his heart in his 39s?    Social History:  reports that she has never smoked. She has never used smokeless tobacco. She reports that she does not drink alcohol or use illicit drugs.  Review of Systems   ROS    EXAM:  BP 110/64 mmHg  Pulse 86  Temp(Src) 97.8 F (36.6 C) (Oral)  Resp 14  Ht 5\' 2"  (1.575 m)  Wt 169 lb 6.4 oz (  76.839 kg)  BMI 30.98 kg/m2  SpO2 97%  Physical Exam She looks well Standing blood pressure on both arms was 106/82 and 108/80  Assessment/Plan:   PITUITARY tumor:  She has normal pituitary function clinically with normal menstrual cycles and normal free T4 level consistently now Also doing clinically well with no hydrocortisone supplementation and can continue without it Discussed possibly using stress doses for any severe illnesses in the next 6 months as a precaution  DIABETES insipidus:   This is resolved  She will come back as needed  There are no Patient Instructions on file for this visit.    Israa Caban 07/28/2015, 11:57 AM

## 2015-09-10 ENCOUNTER — Encounter (HOSPITAL_COMMUNITY): Payer: Self-pay | Admitting: *Deleted

## 2015-09-10 ENCOUNTER — Inpatient Hospital Stay (HOSPITAL_COMMUNITY)
Admission: AD | Admit: 2015-09-10 | Discharge: 2015-09-10 | Disposition: A | Discharge status: Home / Self Care | Payer: Medicaid Other | Source: Ambulatory Visit | Attending: Family Medicine | Admitting: Family Medicine

## 2015-09-10 DIAGNOSIS — N910 Primary amenorrhea: Secondary | ICD-10-CM

## 2015-09-10 DIAGNOSIS — N926 Irregular menstruation, unspecified: Secondary | ICD-10-CM

## 2015-09-10 DIAGNOSIS — Z3202 Encounter for pregnancy test, result negative: Secondary | ICD-10-CM | POA: Diagnosis not present

## 2015-09-10 LAB — URINALYSIS, ROUTINE W REFLEX MICROSCOPIC
BILIRUBIN URINE: NEGATIVE
Glucose, UA: NEGATIVE mg/dL
Ketones, ur: NEGATIVE mg/dL
Nitrite: NEGATIVE
PH: 6 (ref 5.0–8.0)
Protein, ur: NEGATIVE mg/dL
SPECIFIC GRAVITY, URINE: 1.015 (ref 1.005–1.030)

## 2015-09-10 LAB — URINE MICROSCOPIC-ADD ON

## 2015-09-10 LAB — POCT PREGNANCY, URINE: Preg Test, Ur: NEGATIVE

## 2015-09-10 NOTE — MAU Note (Signed)
Patient states her LMP was 07/28/15 and she missed her period in January.  Pt took HPT 2 days ago that was negative. Today she went to the bathroom and passed a small pink clot that she thought "might have been a baby."  She states she is having her period now.  Here to find out if she miscarried.  C/O mild cramping pain 3/10.

## 2015-09-10 NOTE — MAU Provider Note (Signed)
History     CSN: [id number]  Arrival date and time: 09/10/15 1132   First Provider Initiated Contact with Patient 09/10/15 1157         Chief Complaint  Patient presents with  . Metrorrhagia  . Possible Pregnancy   HPI  Gloria Adams is a 25 y.o. female who presents for missed cycle & possible pregnancy.  LMP was 12/21. Currently not on birth control. Reports regular periods for the last year.  Took pregnancy test 3 days ago that was negative.  Reports vaginal bleeding that started today; passed small clot x 1 & pink spotting since then. Mild cramping today that "feels like she's starting her period". Concerned that she may be having a miscarriage since her period was late this month.  Denies any other complaints.  OB History    Gravida Para Term Preterm AB TAB SAB Ectopic Multiple Living   1 1 1  0 0 0 0 0 0 1      Past Medical History  Diagnosis Date  . Pregnancy induced hypertension     end of preg  . Bartholin cyst   . Pituitary macroadenoma [facility]     Past Surgical History  Procedure Laterality Date  . Craniotomy N/A 03/30/2015    Procedure: CRANIOTOMY HYPOPHYSECTOMY TRANSNASAL APPROACH;  Surgeon: Karie Chimera, MD;  Location: [place] [facility];  Service: Neurosurgery;  Laterality: N/A;  . Transnasal approach N/A 03/30/2015    Procedure: TRANSNASAL APPROACH;  Surgeon: Rozetta Nunnery, MD;  Location: [facility];  Service: ENT;  Laterality: N/A;  . Transphenoidal / transnasal hypophysectomy / resection pituitary tumor      Family History  Problem Relation Age of Onset  . Hypertension Father   . Diabetes Maternal Grandmother   . Diabetes Paternal Grandmother   . Stroke Neg Hx   . Heart disease Father     Died of blood clot to his heart in his 30s?    Social History  Substance Use Topics  . Smoking status: Never Smoker   . Smokeless tobacco: Never Used  . Alcohol Use: No    Allergies: No Known Allergies  Prescriptions prior to admission  Medication Sig  Dispense Refill Last Dose  . EPINEPHrine 0.3 mg/0.3 mL IJ SOAJ injection Inject 0.3 mLs (0.3 mg total) into the muscle once. 1 Device 0 Taking    Review of Systems  Constitutional: Negative.   Gastrointestinal: Positive for abdominal pain. Negative for nausea, vomiting, diarrhea and constipation.  Genitourinary:       + vaginal bleeding   Physical Exam   Blood pressure 127/79, pulse 90, temperature 97.5 F (36.4 C), temperature source Oral, resp. rate 18, height 5\' 2"  (1.575 m), weight 170 lb (77.111 kg), last menstrual period 07/28/2015, not currently breastfeeding.  Physical Exam  Constitutional: She appears well-developed and well-nourished. No distress.  HENT:  Head: Normocephalic and atraumatic.  Cardiovascular: Normal rate.   Respiratory: Effort normal. No respiratory distress.  Skin: She is not diaphoretic.  Psychiatric: She has a normal mood and affect. Her behavior is normal. Judgment and thought content normal.    MAU Course  Procedures Results for orders placed or performed during the hospital encounter of 09/10/15 (from the past 24 hour(s))  Urinalysis, Routine w reflex microscopic (not at [facility])     Status: Abnormal   Collection Time: 09/10/15 11:40 AM  Result Value Ref Range   Color, Urine YELLOW YELLOW   APPearance CLEAR CLEAR   Specific Gravity, Urine 1.015 1.005 -  1.030   pH 6.0 5.0 - 8.0   Glucose, UA NEGATIVE NEGATIVE mg/dL   Hgb urine dipstick LARGE (A) NEGATIVE   Bilirubin Urine NEGATIVE NEGATIVE   Ketones, ur NEGATIVE NEGATIVE mg/dL   Protein, ur NEGATIVE NEGATIVE mg/dL   Nitrite NEGATIVE NEGATIVE   Leukocytes, UA SMALL (A) NEGATIVE  Urine microscopic-add on     Status: Abnormal   Collection Time: 09/10/15 11:40 AM  Result Value Ref Range   Squamous Epithelial / LPF 0-5 (A) NONE SEEN   WBC, UA 0-5 0 - 5 WBC/hpf   RBC / HPF 0-5 0 - 5 RBC/hpf   Bacteria, UA FEW (A) NONE SEEN  Pregnancy, urine POC     Status: None   Collection Time: 09/10/15 11:41  AM  Result Value Ref Range   Preg Test, Ur NEGATIVE NEGATIVE    MDM UPT negative today & 3 days ago. Discussed with patient that his is likely her period and with 2 negative pregnancy tests she did not have a miscarriage today. Pt does have hx of pituitary macroadenoma that was removed 03/2015 & is being followed by endocrinology. If continues to have concerns regarding cycle will follow up with her endocrinologist or PCP.   Assessment and Plan  A: 1. Negative pregnancy test   2. Menstrual period late     P: Discharge home Start using [place] as prescribed by PCP F/u with endocrinologist or PCP as needed.   Jorje Guild, NP  09/10/2015, 11:57 AM

## 2015-09-10 NOTE — Discharge Instructions (Signed)
Pituitary Tumors  Pituitary tumors are abnormal growths found in the pituitary gland. The pituitary gland is a small organ--about the size of a dime--located in the center of the brain. It makes hormones that affect growth and the functions of other glands in the body. Most pituitary tumors are benign. This means they are noncancerous. They grow slowly and do not spread to other parts of the body.  A pituitary tumor may make the pituitary gland produce too many hormones. Tumors that make hormones are called functioning tumors (those that do not make hormones are called nonfunctioning tumors). Problems that can be caused by pituitary tumors include:  · Cushing disease. This disease causes fat to build up in the face, back, and chest while the arms and legs become thin.  · Acromegaly. This is a condition in which the hands, feet, and face are larger than normal.  · Breast milk production even though there is no pregnancy.  CAUSES   The cause of most pituitary tumors is not known. In some cases, these kinds of tumors run in a family.  RISK FACTORS  Some cases of pituitary tumors are due to genetic factors that a person inherits that increase the likelihood of developing certain tumors, including pituitary tumors.   SIGNS AND SYMPTOMS   · Headaches.    · Vision problems.    · Weakness or low energy.  · Clear fluid draining from the nose.  · Changes in the sense of smell.  · Feeling sick to your stomach (nauseous) and vomiting.    · Problems caused by the production of too many hormones, such as:      Infertility.    Loss of menstrual periods in women.      Abnormal growth.      High blood pressure (hypertension).      Heat or cold intolerance.      Other skin and body changes.      Nipple discharge.    Decreased sexual function.  DIAGNOSIS   If you develop symptoms, you will be sent for a CT scan or MRI to look for pituitary tumors. If you know that these kinds of tumors run in your family, you may need to have your  blood tested regularly to monitor pituitary hormone levels.  TREATMENT   These tumors are best treated when they are found and diagnosed early. Treatments include:   · Surgical removal of the tumor. This is the most common treatment.  · Radiation therapy. During this treatment, high doses of X-rays are used to kill tumor cells.  · Drug therapy. This involves using certain medicines to block the pituitary gland from producing too many hormones.  HOME CARE INSTRUCTIONS  · Drink plenty of fluids.  · Measure your urine output if directed to do so by your health care provider.  · Do not pick your nose or remove any crusting.  · Do not do any activities that require straining.  · Take all medicines as directed by your health care provider.  · Keep follow-up appointments as directed by your health care provider.  SEEK MEDICAL CARE IF:  · You have sudden, unusual thirst.  · You are urinating frequently.  · You have a headache that will not go away.  · You have new vision changes.  · You notice clear fluid leaking from your nose or ears, a sensation of fluid trickling down the back of your throat, or a salty taste in your mouth.  · You are having trouble concentrating.  SEEK IMMEDIATE MEDICAL CARE IF:  ·   Your symptoms suddenly become severe.  · You have a nosebleed that does not stop after a few minutes.  · You have a fever over 101°F (38.3°C).  · You have a severe headache or a stiff neck.  · You are confused or not as alert as usual.  · You have chest pain or shortness of breath.     This information is not intended to replace advice given to you by your health care provider. Make sure you discuss any questions you have with your health care provider.     Document Released: 07/14/2002 Document Revised: 05/14/2013 Document Reviewed: 01/24/2013  Elsevier Interactive Patient Education ©2016 [employer]

## 2015-11-10 ENCOUNTER — Other Ambulatory Visit: Payer: Self-pay | Admitting: Neurosurgery

## 2015-11-10 DIAGNOSIS — D352 Benign neoplasm of pituitary gland: Secondary | ICD-10-CM

## 2015-11-18 ENCOUNTER — Ambulatory Visit
Admission: RE | Admit: 2015-11-18 | Discharge: 2015-11-18 | Disposition: A | Discharge status: Home / Self Care | Payer: Medicaid Other | Source: Ambulatory Visit | Attending: Neurosurgery | Admitting: Neurosurgery

## 2015-11-18 DIAGNOSIS — D352 Benign neoplasm of pituitary gland: Secondary | ICD-10-CM

## 2015-11-18 MED ORDER — GADOBENATE DIMEGLUMINE 529 MG/ML IV SOLN
8.0000 mL | Freq: Once | INTRAVENOUS | Status: AC | PRN
  Administered 2015-11-18: 8 mL via INTRAVENOUS

## 2015-12-07 ENCOUNTER — Inpatient Hospital Stay (HOSPITAL_COMMUNITY)
Admission: AD | Admit: 2015-12-07 | Discharge: 2015-12-07 | Disposition: A | Discharge status: Home / Self Care | Payer: Medicaid Other | Source: Ambulatory Visit | Attending: Family Medicine | Admitting: Family Medicine

## 2015-12-07 ENCOUNTER — Encounter (HOSPITAL_COMMUNITY): Payer: Self-pay | Admitting: *Deleted

## 2015-12-07 ENCOUNTER — Emergency Department (HOSPITAL_COMMUNITY)
Admission: EM | Admit: 2015-12-07 | Discharge: 2015-12-07 | Disposition: A | Discharge status: Home / Self Care | Payer: Medicaid Other | Source: Home / Self Care | Attending: Emergency Medicine | Admitting: Emergency Medicine

## 2015-12-07 ENCOUNTER — Encounter (HOSPITAL_COMMUNITY): Payer: Self-pay | Admitting: Emergency Medicine

## 2015-12-07 DIAGNOSIS — A449 Bartonellosis, unspecified: Secondary | ICD-10-CM | POA: Diagnosis not present

## 2015-12-07 DIAGNOSIS — B9689 Other specified bacterial agents as the cause of diseases classified elsewhere: Secondary | ICD-10-CM | POA: Insufficient documentation

## 2015-12-07 DIAGNOSIS — R300 Dysuria: Secondary | ICD-10-CM

## 2015-12-07 DIAGNOSIS — Z823 Family history of stroke: Secondary | ICD-10-CM | POA: Diagnosis not present

## 2015-12-07 DIAGNOSIS — N760 Acute vaginitis: Secondary | ICD-10-CM | POA: Insufficient documentation

## 2015-12-07 DIAGNOSIS — N898 Other specified noninflammatory disorders of vagina: Secondary | ICD-10-CM | POA: Insufficient documentation

## 2015-12-07 DIAGNOSIS — Z833 Family history of diabetes mellitus: Secondary | ICD-10-CM | POA: Diagnosis not present

## 2015-12-07 DIAGNOSIS — Z8249 Family history of ischemic heart disease and other diseases of the circulatory system: Secondary | ICD-10-CM | POA: Insufficient documentation

## 2015-12-07 LAB — URINALYSIS, ROUTINE W REFLEX MICROSCOPIC
BILIRUBIN URINE: NEGATIVE
Glucose, UA: NEGATIVE mg/dL
HGB URINE DIPSTICK: NEGATIVE
Ketones, ur: NEGATIVE mg/dL
NITRITE: NEGATIVE
PH: 7 (ref 5.0–8.0)
Protein, ur: NEGATIVE mg/dL
SPECIFIC GRAVITY, URINE: 1.027 (ref 1.005–1.030)

## 2015-12-07 LAB — WET PREP, GENITAL
Sperm: NONE SEEN
Trich, Wet Prep: NONE SEEN
YEAST WET PREP: NONE SEEN

## 2015-12-07 LAB — URINE MICROSCOPIC-ADD ON: RBC / HPF: NONE SEEN RBC/hpf (ref 0–5)

## 2015-12-07 LAB — POC URINE PREG, ED: Preg Test, Ur: NEGATIVE

## 2015-12-07 MED ORDER — METRONIDAZOLE 500 MG PO TABS: 500.0000 mg | ORAL_TABLET | Freq: Two times a day (BID) | ORAL | Status: DC

## 2015-12-07 NOTE — MAU Provider Note (Signed)
History     CSN: [id number]  Arrival date and time: 12/07/15 2111   None     Chief Complaint  Patient presents with  . Dysuria   HPI Pt is not pregnant and pt presents with "vaginal issues" -pt c/o of burning with urination- when urine touches outside. Pt's LMP was 10/16/2015.  Pt had surgery 03/28/2015 for pituitary adenoma.  Pt has had odor with vaginal discharge 1 week ago- has douched 2 times in the last 3 weeks.  Past Medical History  Diagnosis Date  . Pregnancy induced hypertension     end of preg  . Bartholin cyst   . Pituitary macroadenoma [facility])     Past Surgical History  Procedure Laterality Date  . Craniotomy N/A 03/30/2015    Procedure: CRANIOTOMY HYPOPHYSECTOMY TRANSNASAL APPROACH;  Surgeon: Karie Chimera, MD;  Location: [place] NEUR[facility];  Service: Neurosurgery;  Laterality: N/A;  . Transnasal approach N/A 03/30/2015    Procedure: TRANSNASAL APPROACH;  Surgeon: Rozetta Nunnery, MD;  Location: [facility];  Service: ENT;  Laterality: N/A;  . Transphenoidal / transnasal hypophysectomy / resection pituitary tumor      Family History  Problem Relation Age of Onset  . Hypertension Father   . Diabetes Maternal Grandmother   . Diabetes Paternal Grandmother   . Stroke Neg Hx   . Heart disease Father     Died of blood clot to his heart in his 35s?    Social History  Substance Use Topics  . Smoking status: Never Smoker   . Smokeless tobacco: Never Used  . Alcohol Use: No    Allergies: No Known Allergies  Prescriptions prior to admission  Medication Sig Dispense Refill Last Dose  . EPINEPHrine 0.3 mg/0.3 mL IJ SOAJ injection Inject 0.3 mLs (0.3 mg total) into the muscle once. 1 Device 0 rescue  . ibuprofen (ADVIL,MOTRIN) 800 MG tablet Take 800 mg by mouth daily as needed for moderate pain or cramping. Reported on 12/07/2015  0 Not Taking at Unknown time    Review of Systems  Constitutional: Negative for fever and chills.  Gastrointestinal: Positive for  vomiting. Negative for nausea, abdominal pain, diarrhea and constipation.  Genitourinary: Positive for dysuria and urgency. Negative for frequency.  Neurological: Negative for headaches.   Physical Exam   Blood pressure 107/66, pulse 97, temperature 98.7 F (37.1 C), temperature source Oral, resp. rate 18, last menstrual period 10/16/2015, not currently breastfeeding.  Physical Exam  Nursing note and vitals reviewed. Constitutional: She is oriented to person, place, and time. She appears well-developed and well-nourished. No distress.  HENT:  Head: Normocephalic.  Eyes: Pupils are equal, round, and reactive to light.  Neck: Normal range of motion. Neck supple.  Cardiovascular: Normal rate.   Respiratory: Effort normal.  GI: Soft. She exhibits no distension. There is no tenderness. There is no rebound.  Genitourinary:  BUS neg-  No lesions or erythema noted; vaginal mucosa pink, small amount of white discharge in vault; cervix clean, NT; uterus NSSC NT; adnexa without palpable  Enlargement or tenderness  Musculoskeletal: Normal range of motion.  Neurological: She is alert and oriented to person, place, and time.  Skin: Skin is warm and dry.  Psychiatric: She has a normal mood and affect.   Results for orders placed or performed during the hospital encounter of 12/07/15 (from the past 24 hour(s))  Wet prep, genital     Status: Abnormal   Collection Time: 12/07/15 10:30 PM  Result Value Ref Range  Yeast Wet Prep HPF POC NONE SEEN NONE SEEN   Trich, Wet Prep NONE SEEN NONE SEEN   Clue Cells Wet Prep HPF POC PRESENT (A) NONE SEEN   WBC, Wet Prep HPF POC MODERATE (A) NONE SEEN   Sperm NONE SEEN     MAU Course  Procedures GC/chlamydia pending Care signed over to Kerry Hough, PA Assessment and Plan  Vaginal discharge dysuria  LINEBERRY,SUSAN 12/07/2015, 10:17 PM   A: Bacterial Vaginosis  P: Discharge home Rx for Flagyl given to patient Discussed use of probiotics and  hygiene products to avoid for recurrence Patient advised to follow-up with [facility] as needed Patient may return to MAU as needed or if her condition were to change or worsen  Luvenia Redden, PA-C  12/07/2015 11:02 PM

## 2015-12-07 NOTE — Discharge Instructions (Signed)

## 2015-12-07 NOTE — ED Notes (Signed)
No answer from pt for room when called from lobby x 1.

## 2015-12-07 NOTE — MAU Note (Signed)
Pt c/o pain with urination and increase in frequency x3 days. Denies abdominal pain. Denies fever or vaginal bleeding. LMP: 10/16/2015. Has hx of irregular periods.

## 2015-12-07 NOTE — ED Notes (Signed)
Pt states that she has had dysuria for several days. Denies vaginal discharge. Alert and oriented.

## 2015-12-08 LAB — GC/CHLAMYDIA PROBE AMP (~~LOC~~) NOT AT ARMC
CHLAMYDIA, DNA PROBE: NEGATIVE
Neisseria Gonorrhea: NEGATIVE

## 2015-12-08 NOTE — ED Provider Notes (Signed)
Patient left without being seen.   Wandra Arthurs, MD 12/08/15 [phone]

## 2015-12-21 ENCOUNTER — Encounter: Payer: Self-pay | Admitting: Endocrinology

## 2015-12-21 ENCOUNTER — Ambulatory Visit (INDEPENDENT_AMBULATORY_CARE_PROVIDER_SITE_OTHER): Payer: Medicaid Other | Admitting: Endocrinology

## 2015-12-21 VITALS — BP 114/76 | HR 80 | Temp 98.1°F | Resp 14 | Ht 62.0 in | Wt 176.4 lb

## 2015-12-21 DIAGNOSIS — N912 Amenorrhea, unspecified: Secondary | ICD-10-CM | POA: Diagnosis not present

## 2015-12-21 LAB — T4, FREE: FREE T4: 0.78 ng/dL (ref 0.60–1.60)

## 2015-12-21 LAB — FOLLICLE STIMULATING HORMONE: FSH: 5.5 m[IU]/mL

## 2015-12-21 LAB — TSH: TSH: 1.81 u[IU]/mL (ref 0.35–4.50)

## 2015-12-21 NOTE — Progress Notes (Signed)
Patient ID: Gloria Adams, female   DOB: 1991-04-28, 25 y.o.   MRN: [id number]           Chief complaint:  Skipped menstrual cycles   History of Present Illness:  Background information: Patient had presented to the optician with history of blurred vision and subsequently was seen by neurologist who found her pituitary lesion on MRI scan. She had surgery for a sellar and suprasellar cystic lesion on 03/30/15 Last MRI showed resection of the tumor Postoperatively the patient  developed diabetes insipidus. She was discharged on 0.1 mg twice a day of the DDAVP. On her own she stopped taking this medication  She has not had any increased urination or nocturia.  Pituitary adenoma: MRI in 4/17 did not show any residual tumor although does show partially empty sella   ENDOCRINE  function:  She had menstrual cycles after her pregnancy about a year ago and had stopped lactating   She says now her menstrual cycles have stopped for the last 2 months.  She did skip 1 month in January but had normal cycles in February and March Does not complain of hot flashes or milky breast discharge. Previously prolactin had been normal  She does not complain of any fatigue Her hydrocortisone was tapered off and she feels fairly good without any weakness or change in appetite Only occasionally may have mild nausea   Her free T4 was also normal previously  Lab Results  Component Value Date   CREATININE 0.65 07/08/2015   BUN 7 07/08/2015   NA 137 07/08/2015   K 4.4 07/08/2015   CL 104 07/08/2015   CO2 28 07/08/2015    Lab Results  Component Value Date   FREET4 0.99 05/20/2015   FREET4 0.73 04/30/2015        Medication List       This list is accurate as of: 12/21/15  8:31 AM.  Always use your most recent med list.               EPINEPHrine 0.3 mg/0.3 mL Soaj injection  Commonly known as:  EPI-PEN  Inject 0.3 mLs (0.3 mg total) into the muscle once.     ibuprofen 800 MG tablet    Commonly known as:  ADVIL,MOTRIN  Take 800 mg by mouth daily as needed for moderate pain or cramping. Reported on 12/21/2015     metroNIDAZOLE 500 MG tablet  Commonly known as:  FLAGYL  Take 1 tablet (500 mg total) by mouth 2 (two) times daily.        Allergies: No Known Allergies  Past Medical History  Diagnosis Date  . Pregnancy induced hypertension     end of preg  . Bartholin cyst   . Pituitary macroadenoma [facility])     Past Surgical History  Procedure Laterality Date  . Craniotomy N/A 03/30/2015    Procedure: CRANIOTOMY HYPOPHYSECTOMY TRANSNASAL APPROACH;  Surgeon: Karie Chimera, MD;  Location: [place] NEUR[facility] ORS;  Service: Neurosurgery;  Laterality: N/A;  . Transnasal approach N/A 03/30/2015    Procedure: TRANSNASAL APPROACH;  Surgeon: Rozetta Nunnery, MD;  Location: [facility];  Service: ENT;  Laterality: N/A;  . Transphenoidal / transnasal hypophysectomy / resection pituitary tumor      Family History  Problem Relation Age of Onset  . Hypertension Father   . Diabetes Maternal Grandmother   . Diabetes Paternal Grandmother   . Stroke Neg Hx   . Heart disease Father     Died of blood clot  to his heart in his 19s?    Social History:  reports that she has never smoked. She has never used smokeless tobacco. She reports that she does not drink alcohol or use illicit drugs.     ROS  Wt Readings from Last 3 Encounters:  12/21/15 176 lb 6.4 oz (80.015 kg)  09/10/15 170 lb (77.111 kg)  07/28/15 169 lb 6.4 oz (76.839 kg)    EXAM:  BP 114/76 mmHg  Pulse 80  Temp(Src) 98.1 F (36.7 C)  Resp 14  Ht 5\' 2"  (1.575 m)  Wt 176 lb 6.4 oz (80.015 kg)  BMI 32.26 kg/m2  SpO2 99%  LMP 10/16/2015  Physical Exam She looks well Standing blood pressure 110/78 Biceps reflexes normal  Assessment/Plan:   Amenorrhea:  Etiology of this is unclear since she has had normal pituitary function otherwise and her menstrual cycles were normal for 6 months after her pituitary  surgery. 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    Gloria Adams 12/21/2015, 8:31 AM

## 2015-12-22 LAB — ESTRADIOL: ESTRADIOL: 95.7 pg/mL

## 2015-12-23 LAB — TESTOSTERONE, FREE, TOTAL, SHBG
Sex Hormone Binding: 20.6 nmol/L — ABNORMAL LOW (ref 24.6–122.0)
TESTOSTERONE: 35 ng/dL (ref 8–48)
Testosterone, Free: 2.1 pg/mL (ref 0.0–4.2)

## 2015-12-23 LAB — PROLACTIN: Prolactin: 18.6 ng/mL (ref 4.8–23.3)

## 2015-12-23 NOTE — Progress Notes (Signed)
Quick Note:  Please let patient know that the lab results show overall normal, levels. Recommend trial of medroxyprogesterone 10 mg daily for 10 days to restart menstrual cycle. If not successful recommending gynecological consultation ______

## 2015-12-24 ENCOUNTER — Other Ambulatory Visit: Payer: Self-pay | Admitting: *Deleted

## 2015-12-24 MED ORDER — MEDROXYPROGESTERONE ACETATE 10 MG PO TABS: 10.0000 mg | ORAL_TABLET | Freq: Every day | ORAL | Status: DC

## 2015-12-24 NOTE — Telephone Encounter (Signed)
Per Dr Ronnie Derby result note.

## 2016-07-23 ENCOUNTER — Encounter (HOSPITAL_COMMUNITY): Payer: Self-pay | Admitting: *Deleted

## 2016-07-23 ENCOUNTER — Inpatient Hospital Stay (HOSPITAL_COMMUNITY)
Admission: AD | Admit: 2016-07-23 | Discharge: 2016-07-23 | Disposition: A | Discharge status: Home / Self Care | Payer: Medicaid Other | Source: Ambulatory Visit | Attending: Obstetrics and Gynecology | Admitting: Obstetrics and Gynecology

## 2016-07-23 DIAGNOSIS — N911 Secondary amenorrhea: Secondary | ICD-10-CM

## 2016-07-23 DIAGNOSIS — L292 Pruritus vulvae: Secondary | ICD-10-CM

## 2016-07-23 DIAGNOSIS — Z3202 Encounter for pregnancy test, result negative: Secondary | ICD-10-CM | POA: Insufficient documentation

## 2016-07-23 HISTORY — DX: Anxiety disorder, unspecified: F41.9

## 2016-07-23 HISTORY — DX: Major depressive disorder, single episode, unspecified: F32.9

## 2016-07-23 HISTORY — DX: Depression, unspecified: F32.A

## 2016-07-23 LAB — WET PREP, GENITAL
CLUE CELLS WET PREP: NONE SEEN
Sperm: NONE SEEN
TRICH WET PREP: NONE SEEN
YEAST WET PREP: NONE SEEN

## 2016-07-23 LAB — URINALYSIS, ROUTINE W REFLEX MICROSCOPIC
Bilirubin Urine: NEGATIVE
Glucose, UA: NEGATIVE mg/dL
KETONES UR: NEGATIVE mg/dL
Leukocytes, UA: NEGATIVE
Nitrite: NEGATIVE
Protein, ur: NEGATIVE mg/dL
Specific Gravity, Urine: 1.023 (ref 1.005–1.030)
pH: 5 (ref 5.0–8.0)

## 2016-07-23 LAB — POCT PREGNANCY, URINE: PREG TEST UR: NEGATIVE

## 2016-07-23 LAB — GLUCOSE, CAPILLARY: GLUCOSE-CAPILLARY: 75 mg/dL (ref 65–99)

## 2016-07-23 MED ORDER — TRIAMCINOLONE ACETONIDE 0.1 % EX CREA: 1.0000 "application " | TOPICAL_CREAM | Freq: Two times a day (BID) | CUTANEOUS | 0 refills | Status: AC

## 2016-07-23 NOTE — MAU Note (Signed)
Having a lot of frequent urination, happening a lot the last in the last couple months.  Denies pain or burning with urination.  Has some mild cramping and vaginal itching started a few days ago.

## 2016-07-23 NOTE — Discharge Instructions (Signed)
Pruritus Introduction Pruritus is an itching feeling. There are many different conditions and factors that can make your skin itchy. Dry skin is one of the most common causes of itching. Most cases of itching do not require medical attention. Itchy skin can turn into a rash. Follow these instructions at home: Watch your pruritus for any changes. Take these steps to help with your condition: Skin Care  Moisturize your skin as needed. A moisturizer that contains petroleum jelly is best for keeping moisture in your skin.  Take or apply medicines only as directed by your health care provider. This may include:  Corticosteroid cream.  Anti-itch lotions.  Oral anti-histamines.  Apply cool compresses to the affected areas.  Try taking a bath with:  Epsom salts. Follow the instructions on the packaging. You can get these at your local pharmacy or grocery store.  Baking soda. Pour a small amount into the bath as directed by your health care provider.  Colloidal oatmeal. Follow the instructions on the packaging. You can get this at your local pharmacy or grocery store.  Try applying baking soda paste to your skin. Stir water into baking soda until it reaches a paste-like consistency.  Do not scratch your skin.  Avoid hot showers or baths, which can make itching worse. A cold shower may help with itching as long as you use a moisturizer after.  Avoid scented soaps, detergents, and perfumes. Use gentle soaps, detergents, perfumes, and other cosmetic products. General instructions  Avoid wearing tight clothes.  Keep a journal to help track what causes your itch. Write down:  What you eat.  What cosmetic products you use.  What you drink.  What you wear. This includes jewelry.  Use a humidifier. This keeps the air moist, which helps to prevent dry skin. Contact a health care provider if:  The itching does not go away after several days.  You sweat at night.  You have weight  loss.  You are unusually thirsty.  You urinate more than normal.  You are more tired than normal.  You have abdominal pain.  Your skin tingles.  You feel weak.  Your skin or the whites of your eyes look yellow (jaundice).  Your skin feels numb. This information is not intended to replace advice given to you by your health care provider. Make sure you discuss any questions you have with your health care provider. Document Released: 04/05/2011 Document Revised: 12/30/2015 Document Reviewed: 07/20/2014  2017 [employer]   Secondary Amenorrhea Secondary amenorrhea is the stopping of menstrual flow for 3-6 months in a female who has previously had periods. There are many possible causes. Most of these causes are not serious. Usually, treating the underlying problem causing the loss of menses will return your periods to normal. What are the causes? Some common and uncommon causes of not menstruating include:  Malnutrition.  Low blood sugar (hypoglycemia).  Polycystic ovary disease.  Stress or fear.  Breastfeeding.  Hormone imbalance.  Ovarian failure.  Medicines.  Extreme obesity.  Cystic fibrosis.  Low body weight or drastic weight reduction from any cause.  Early menopause.  Removal of ovaries or uterus.  Contraceptives.  Illness.  Long-term (chronic) illnesses.  Cushing syndrome.  Thyroid problems.  Birth control pills, patches, or vaginal rings for birth control. What increases the risk? You may be at greater risk of secondary amenorrhea if:  You have a family history of this condition.  You have an eating disorder.  You do athletic training. How is  this diagnosed? A diagnosis is made by your health care provider taking a medical history and doing a physical exam. This will include a pelvic exam to check for problems with your reproductive organs. Pregnancy must be ruled out. Often, numerous blood tests are done to measure different hormones in the  body. Urine testing may be done. Specialized exams (ultrasound, CT scan, MRI, or hysteroscopy) may have to be done as well as measuring the body mass index (BMI). How is this treated? Treatment depends on the cause of the amenorrhea. If an eating disorder is present, this can be treated with an adequate diet and therapy. Chronic illnesses may improve with treatment of the illness. Amenorrhea may be corrected with medicines, lifestyle changes, or surgery. If the amenorrhea cannot be corrected, it is sometimes possible to create a false menstruation with medicines. Follow these instructions at home:  Maintain a healthy diet.  Manage weight problems.  Exercise regularly but not excessively.  Get adequate sleep.  Manage stress.  Be aware of changes in your menstrual cycle. Keep a record of when your periods occur. Note the date your period starts, how long it lasts, and any problems. Contact a health care provider if: Your symptoms do not get better with treatment. This information is not intended to replace advice given to you by your health care provider. Make sure you discuss any questions you have with your health care provider. Document Released: 09/04/2006 Document Revised: 12/30/2015 Document Reviewed: 01/09/2013 Elsevier Interactive Patient Education  2017 [employer].

## 2016-07-24 LAB — URINE CULTURE: Special Requests: NORMAL

## 2016-07-24 LAB — GC/CHLAMYDIA PROBE AMP (~~LOC~~) NOT AT ARMC
Chlamydia: NEGATIVE
Neisseria Gonorrhea: NEGATIVE

## 2016-10-09 ENCOUNTER — Other Ambulatory Visit (HOSPITAL_COMMUNITY): Payer: Self-pay | Admitting: Neurosurgery

## 2016-10-09 DIAGNOSIS — D352 Benign neoplasm of pituitary gland: Secondary | ICD-10-CM

## 2016-10-16 ENCOUNTER — Ambulatory Visit (HOSPITAL_COMMUNITY): Admission: RE | Admit: 2016-10-16 | Payer: Self-pay | Source: Ambulatory Visit

## 2016-10-27 ENCOUNTER — Ambulatory Visit (HOSPITAL_COMMUNITY): Payer: Self-pay

## 2016-11-06 ENCOUNTER — Ambulatory Visit (HOSPITAL_COMMUNITY)
Admission: RE | Admit: 2016-11-06 | Discharge: 2016-11-06 | Disposition: A | Discharge status: Home / Self Care | Payer: Self-pay | Source: Ambulatory Visit | Attending: Neurosurgery | Admitting: Neurosurgery

## 2016-11-06 DIAGNOSIS — H47092 Other disorders of optic nerve, not elsewhere classified, left eye: Secondary | ICD-10-CM | POA: Insufficient documentation

## 2016-11-06 DIAGNOSIS — D352 Benign neoplasm of pituitary gland: Secondary | ICD-10-CM | POA: Insufficient documentation

## 2016-11-06 MED ORDER — GADOBENATE DIMEGLUMINE 529 MG/ML IV SOLN
20.0000 mL | Freq: Once | INTRAVENOUS | Status: AC
  Administered 2016-11-06: 17 mL via INTRAVENOUS

## 2016-11-20 ENCOUNTER — Ambulatory Visit: Payer: Self-pay | Admitting: Endocrinology

## 2017-02-27 IMAGING — MR MR HEAD WO/W CM
12 of 15 series · 30 of 48 positions shown · IV contrast (multihance)
Comparison: None.

CLINICAL DATA: 24-year-old female with recent onset visual field
changes bilaterally. Temporal field loss. Initial encounter.

EXAM:
MRI HEAD WITHOUT AND WITH CONTRAST
MRA HEAD WITHOUT CONTRAST
TECHNIQUE: Multiplanar, multiecho pulse sequences of the brain and surrounding
structures were obtained without and with intravenous contrast.
Angiographic images of the head were obtained using MRA technique
without contrast.
CONTRAST:  8mL MULTIHANCE GADOBENATE DIMEGLUMINE 529 MG/ML IV SOLN

[Series 2: T1 · sagittal · 5.0mm · 0.47mm/px · 1 of 24 slices shown (1 of 3)]
[im 1/24]
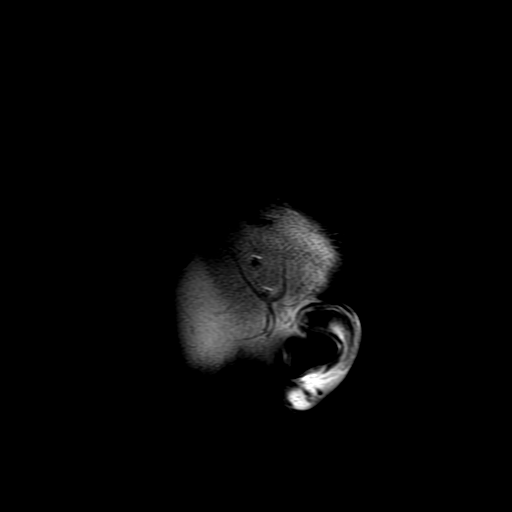

[Series 4: DWI · axial · 3.0mm · 1.09mm/px · z∈[-74,+57]mm · 6 of 100 slices shown (1 of 4)]
[im 1/100]
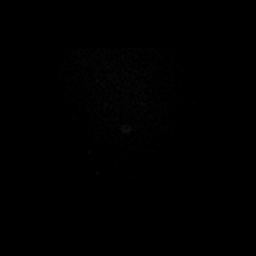
[im 20/100]
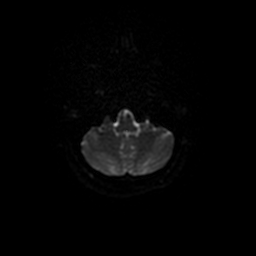
[im 40/100]
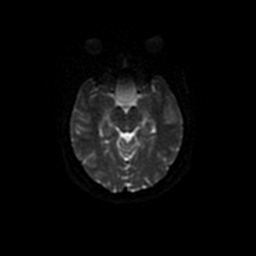
[im 60/100]
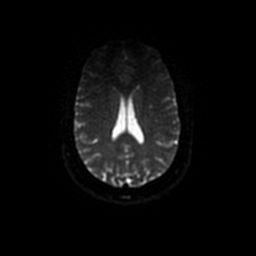
[im 80/100]
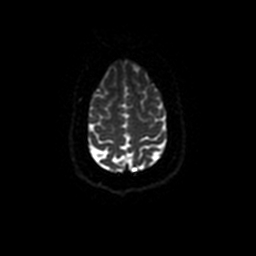
[im 100/100]
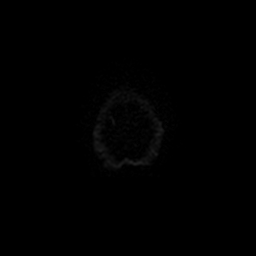

[Series 5: DWI · coronal · 5.0mm · 1.09mm/px · 6 of 76 slices shown (2 of 4)]
[im 1/76]
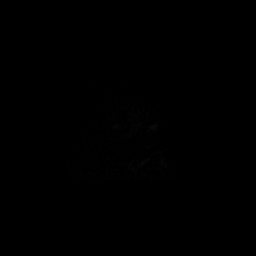
[im 16/76]
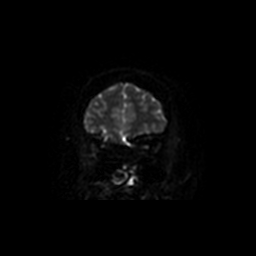
[im 31/76]
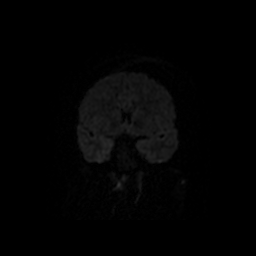
[im 46/76]
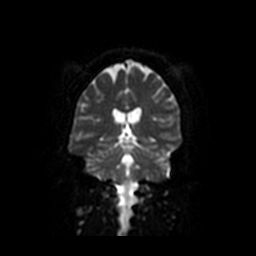
[im 61/76]
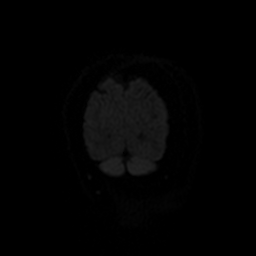
[im 76/76]
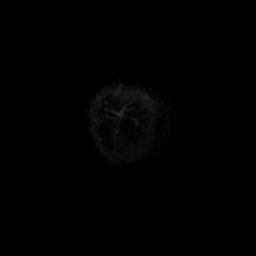

[Series 6: (id) mt fs · axial · 1.4mm · 0.39mm/px · z∈[-66,-47]mm · 2 of 156 slices shown]
[im 1/156]
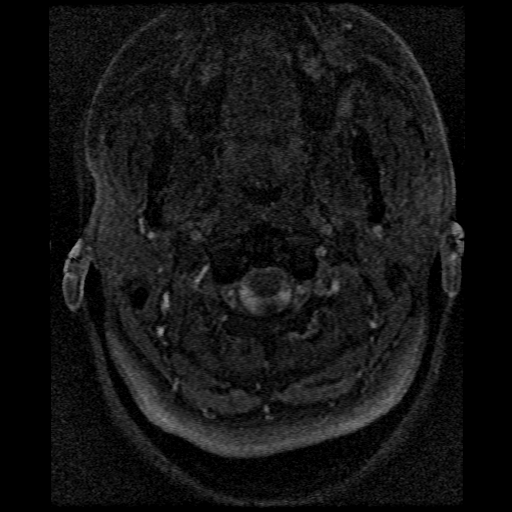
[im 32/156]
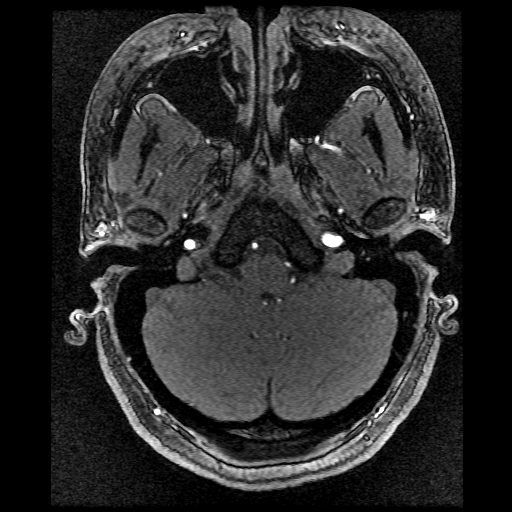

[Series 7: T2 · axial · 5.0mm · 0.43mm/px · z∈[-70,+57]mm · 2 of 24 slices shown]
[im 1/24]
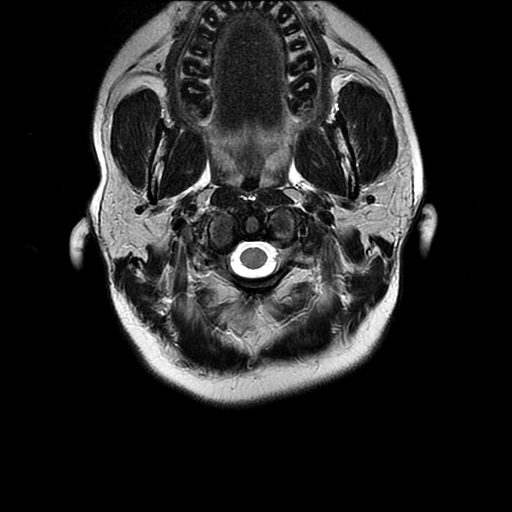
[im 24/24]
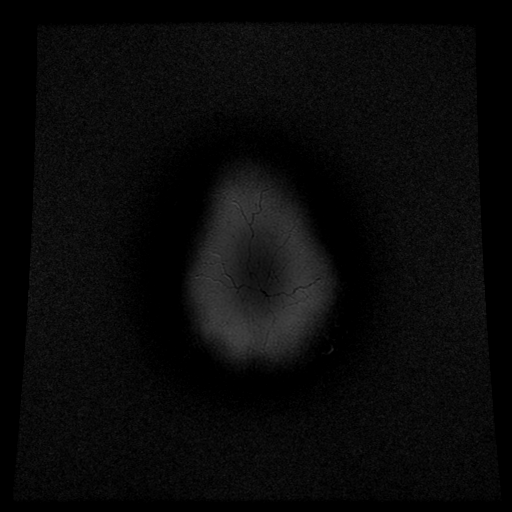

[Series 9: FLAIR · axial · 5.0mm · 0.43mm/px · z∈[-70,+57]mm · 2 of 24 slices shown]
[im 1/24]
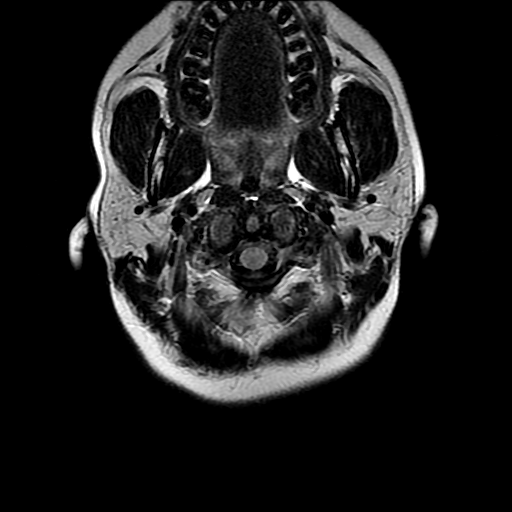
[im 24/24]
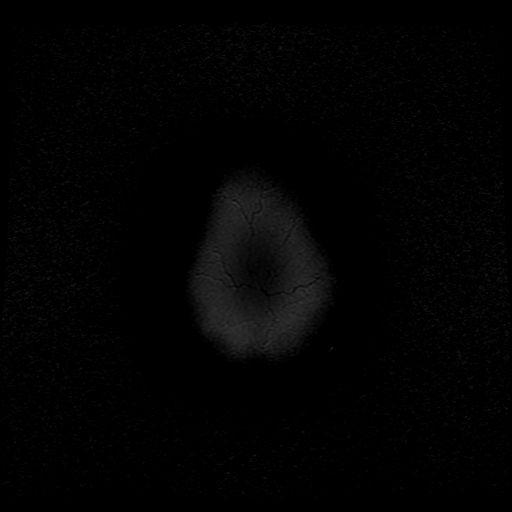

[Series 10: T1 · sagittal · 3.0mm · 0.35mm/px · 1 of 13 slices shown (2 of 3)]
[im 1/13]
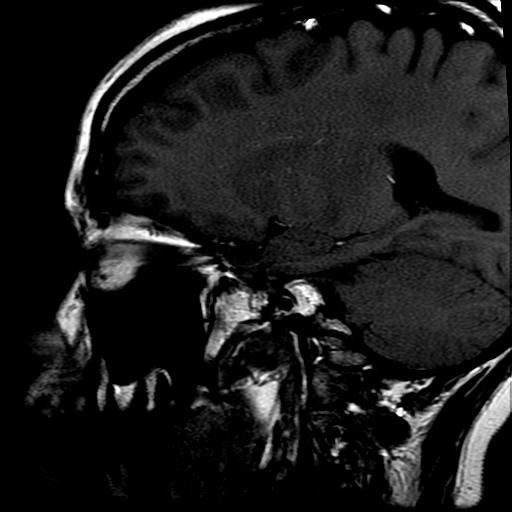

[Series 11: T1 · coronal · 3.0mm · 0.35mm/px · 1 of 13 slices shown (3 of 3)]
[im 1/13]
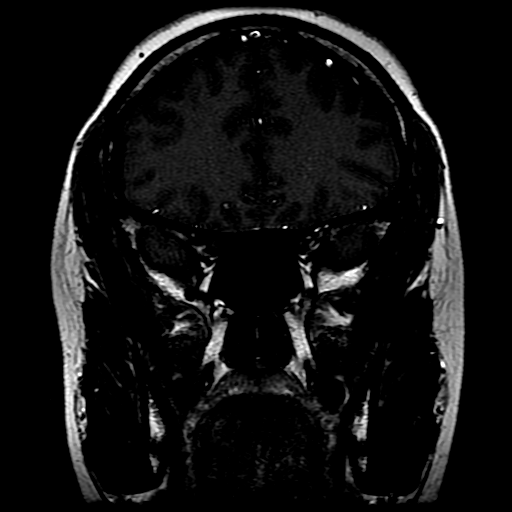

[Series 14: T1 post-contrast · coronal · 3.0mm · 0.35mm/px · 1 of 13 slices shown (1 of 2)]
[im 1/13]
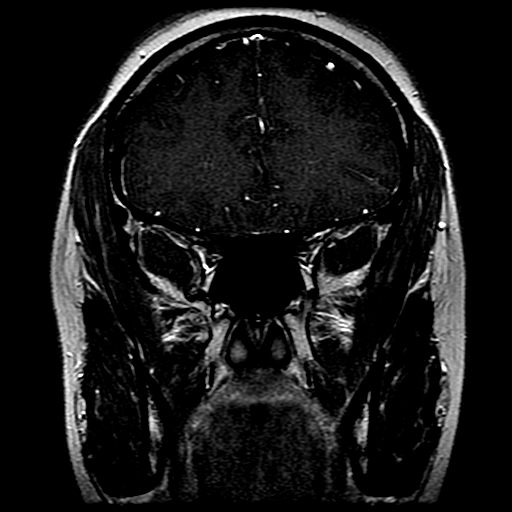

[Series 15: T1 post-contrast · coronal · 3.0mm · 0.35mm/px · 1 of 13 slices shown (2 of 2)]
[im 1/13]
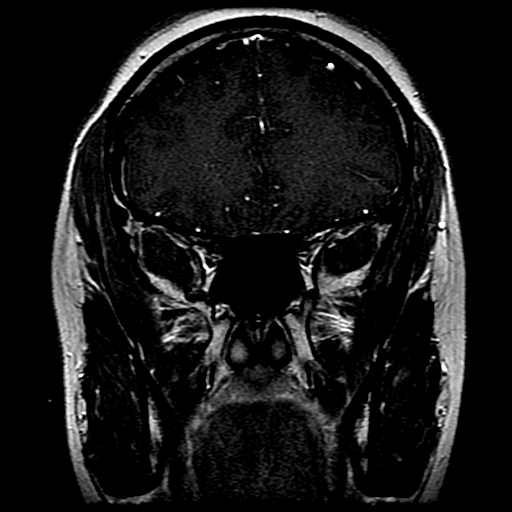

[Series 400: DWI · axial · 3.0mm · 1.09mm/px · z∈[-74,+57]mm · 4 of 50 slices shown (3 of 4)]
[im 1/50]
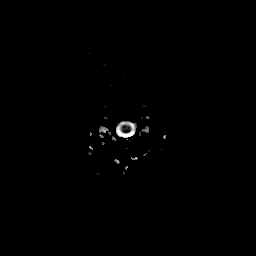
[im 17/50]
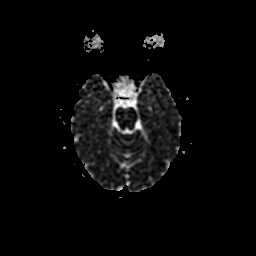
[im 33/50]
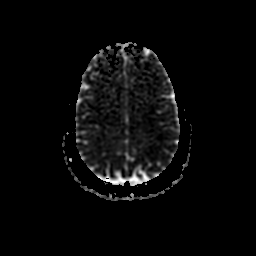
[im 50/50]
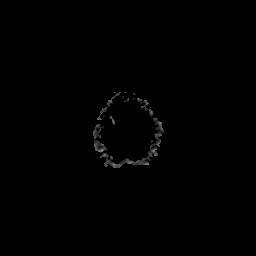

[Series 500: DWI · coronal · 5.0mm · 1.09mm/px · 3 of 38 slices shown (4 of 4)]
[im 1/38]
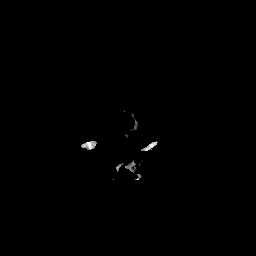
[im 19/38]
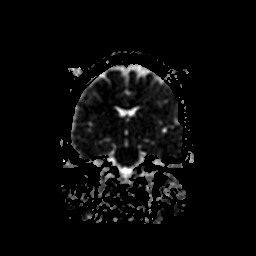
[im 38/38]
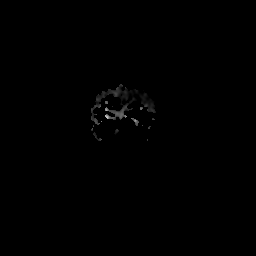

[30 of 48 positions shown; findings below may reference images not displayed]

FINDINGS: MRI HEAD FINDINGS

Intra sellar mass with suprasellar extension described below.

Normal cerebral volume. No restricted diffusion to suggest acute
infarction. No midline shift, ventriculomegaly, extra-axial
collection or acute intracranial hemorrhage. Cervicomedullary
junction within normal limits. Negative visualized cervical spine.
Major intracranial vascular flow voids are preserved. Largely normal
gray and white matter signal throughout the brain; there are small
number of nonspecific subcortical white matter small foci of T2 and
FLAIR hyperintensity, mostly in the right parietal lobe.

There is a large cystic mass arising within the sella turcica with
suprasellar extension, encompassing 21 x 24 x 32 mm (AP by
transverse by CC). Associated suprasellar region mass effect
including mass effect on the optic chiasm and pre chiasmatic optic
nerves (series 11, image 6). There is only minimal rim enhancement
of the mass, with perhaps slightly greater rim thickening
posteriorly (series 13, image 23) which might be related to the
pituitary infundibulum. No intracystic complex or solid components.
The contents are nearly isointense to CSF on FLAIR. No normal
pituitary parenchyma is identified. No associated cerebral edema. No
invasion of the cavernous sinuses or clivus. The sphenoid sinuses
are clear.

Postcontrast enhancement elsewhere is normal.

Other paranasal sinuses and mastoids are clear. Intraorbital soft
tissues are within normal limits. Negative scalp soft tissues.
Visualized bone marrow signal is within normal limits.

MRA HEAD FINDINGS

Antegrade flow in the posterior circulation. Dominant distal left
vertebral artery. Normal PICA origins, the right vertebral
functionally terminates in PICA. Diminutive basilar artery, with
fetal type bilateral PCA origins. No basilar stenosis. Normal SCA
origins. Bilateral posterior communicating arteries and PCA branches
are within normal limits.

Antegrade flow in both ICA siphons. No siphon stenosis. Normal
ophthalmic and posterior communicating artery origins. Mild outward
mass effect on the cavernous and supraclinoid ICA segments related
to the intra sellar mass. Patent carotid termini. Codominant ACA A1
segments. Diminutive or absent anterior communicating artery.
Visualized bilateral ACA and MCA branches are within normal limits.
IMPRESSION: 1. Large, entirely cystic and simple appearing intra sellar mass
with suprasellar extension and suprasellar mass effect. 21 x 24 x 32
mm (AP by transverse by CC). Main differential considerations are a
large Rathke cleft cyst and cystic pituitary macro adenoma.
2. Negative intracranial MRA, aside from mass effect on the distal
ICAs and proximal ACAs from the lesion in #1.
3. Mild nonspecific subcortical cerebral white matter signal
changes.
